# Patient Record
Sex: Male | Born: 1985 | Race: White | Hispanic: Yes | Marital: Single | Smoking: Former smoker
Health system: Southern US, Community
[De-identification: ages and names within clinical notes are randomized; demographics above are authoritative.]

---

## 2013-05-04 ENCOUNTER — Inpatient Hospital Stay (HOSPITAL_COMMUNITY)
Admission: EM | Admit: 2013-05-04 | Discharge: 2013-05-16 | DRG: 864 | Disposition: A | Discharge status: Home / Self Care | Payer: Medicaid Other | Attending: Internal Medicine | Admitting: Internal Medicine

## 2013-05-04 ENCOUNTER — Encounter (HOSPITAL_COMMUNITY): Payer: Self-pay | Admitting: *Deleted

## 2013-05-04 ENCOUNTER — Emergency Department (HOSPITAL_COMMUNITY): Payer: Medicaid Other

## 2013-05-04 DIAGNOSIS — Z87891 Personal history of nicotine dependence: Secondary | ICD-10-CM

## 2013-05-04 DIAGNOSIS — R0609 Other forms of dyspnea: Secondary | ICD-10-CM | POA: Diagnosis present

## 2013-05-04 DIAGNOSIS — R7402 Elevation of levels of lactic acid dehydrogenase (LDH): Secondary | ICD-10-CM | POA: Diagnosis present

## 2013-05-04 DIAGNOSIS — R21 Rash and other nonspecific skin eruption: Secondary | ICD-10-CM | POA: Diagnosis present

## 2013-05-04 DIAGNOSIS — E876 Hypokalemia: Secondary | ICD-10-CM | POA: Diagnosis present

## 2013-05-04 DIAGNOSIS — E861 Hypovolemia: Secondary | ICD-10-CM | POA: Diagnosis present

## 2013-05-04 DIAGNOSIS — R0989 Other specified symptoms and signs involving the circulatory and respiratory systems: Secondary | ICD-10-CM | POA: Diagnosis present

## 2013-05-04 DIAGNOSIS — J039 Acute tonsillitis, unspecified: Secondary | ICD-10-CM

## 2013-05-04 DIAGNOSIS — R7401 Elevation of levels of liver transaminase levels: Secondary | ICD-10-CM | POA: Diagnosis present

## 2013-05-04 DIAGNOSIS — R599 Enlarged lymph nodes, unspecified: Secondary | ICD-10-CM | POA: Diagnosis present

## 2013-05-04 DIAGNOSIS — J189 Pneumonia, unspecified organism: Secondary | ICD-10-CM

## 2013-05-04 DIAGNOSIS — R509 Fever, unspecified: Principal | ICD-10-CM | POA: Diagnosis present

## 2013-05-04 DIAGNOSIS — IMO0001 Reserved for inherently not codable concepts without codable children: Secondary | ICD-10-CM | POA: Diagnosis present

## 2013-05-04 DIAGNOSIS — E871 Hypo-osmolality and hyponatremia: Secondary | ICD-10-CM

## 2013-05-04 DIAGNOSIS — J029 Acute pharyngitis, unspecified: Secondary | ICD-10-CM | POA: Diagnosis present

## 2013-05-04 DIAGNOSIS — R000 Tachycardia, unspecified: Secondary | ICD-10-CM | POA: Diagnosis present

## 2013-05-04 DIAGNOSIS — R590 Localized enlarged lymph nodes: Secondary | ICD-10-CM

## 2013-05-04 DIAGNOSIS — R011 Cardiac murmur, unspecified: Secondary | ICD-10-CM | POA: Diagnosis present

## 2013-05-04 DIAGNOSIS — R072 Precordial pain: Secondary | ICD-10-CM | POA: Diagnosis present

## 2013-05-04 LAB — URINALYSIS, ROUTINE W REFLEX MICROSCOPIC
Glucose, UA: NEGATIVE mg/dL
Hgb urine dipstick: NEGATIVE
Leukocytes, UA: NEGATIVE
Protein, ur: 30 mg/dL — AB
Specific Gravity, Urine: 1.024 (ref 1.005–1.030)
pH: 6 (ref 5.0–8.0)

## 2013-05-04 LAB — CBC WITH DIFFERENTIAL/PLATELET
Basophils Absolute: 0 10*3/uL (ref 0.0–0.1)
Basophils Relative: 0 % (ref 0–1)
Eosinophils Absolute: 0 10*3/uL (ref 0.0–0.7)
Eosinophils Relative: 0 % (ref 0–5)
HCT: 42.4 % (ref 39.0–52.0)
Lymphocytes Relative: 9 % — ABNORMAL LOW (ref 12–46)
MCHC: 36.6 g/dL — ABNORMAL HIGH (ref 30.0–36.0)
MCV: 83.8 fL (ref 78.0–100.0)
Monocytes Absolute: 0.7 10*3/uL (ref 0.1–1.0)
Platelets: 134 10*3/uL — ABNORMAL LOW (ref 150–400)
RDW: 11.7 % (ref 11.5–15.5)
WBC: 6.8 10*3/uL (ref 4.0–10.5)

## 2013-05-04 LAB — URINE MICROSCOPIC-ADD ON

## 2013-05-04 LAB — COMPREHENSIVE METABOLIC PANEL
ALT: 19 U/L (ref 0–53)
AST: 56 U/L — ABNORMAL HIGH (ref 0–37)
Albumin: 3.2 g/dL — ABNORMAL LOW (ref 3.5–5.2)
CO2: 25 mEq/L (ref 19–32)
Calcium: 8.2 mg/dL — ABNORMAL LOW (ref 8.4–10.5)
Creatinine, Ser: 0.94 mg/dL (ref 0.50–1.35)
GFR calc non Af Amer: >90 mL/min (ref >90)
Sodium: 127 mEq/L — ABNORMAL LOW (ref 135–145)
Total Protein: 7.2 g/dL (ref 6.0–8.3)

## 2013-05-04 LAB — PROCALCITONIN: Procalcitonin: 0.88 ng/mL

## 2013-05-04 LAB — CG4 I-STAT (LACTIC ACID): Lactic Acid, Venous: 1.32 mmol/L (ref 0.5–2.2)

## 2013-05-04 MED ORDER — SODIUM CHLORIDE 0.9 % IV SOLN
1000.0000 mL | Freq: Once | INTRAVENOUS | Status: AC
  Administered 2013-05-05: 1000 mL via INTRAVENOUS

## 2013-05-04 MED ORDER — SODIUM CHLORIDE 0.9 % IV BOLUS (SEPSIS)
1000.0000 mL | Freq: Once | INTRAVENOUS | Status: AC
  Administered 2013-05-04: 1000 mL via INTRAVENOUS

## 2013-05-04 MED ORDER — ACETAMINOPHEN 325 MG PO TABS
650.0000 mg | ORAL_TABLET | Freq: Four times a day (QID) | ORAL | Status: DC | PRN
  Administered 2013-05-04: 650 mg via ORAL
  Filled 2013-05-04: qty 1
  Filled 2013-05-04 (×18): qty 2
  Filled 2013-05-04: qty 1
  Filled 2013-05-04 (×6): qty 2

## 2013-05-04 MED ORDER — SODIUM CHLORIDE 0.9 % IV SOLN: 1000.0000 mL | INTRAVENOUS | Status: DC

## 2013-05-04 MED ORDER — SODIUM CHLORIDE 0.9 % IV SOLN
1000.0000 mL | Freq: Once | INTRAVENOUS | Status: AC
  Administered 2013-05-04: 1000 mL via INTRAVENOUS

## 2013-05-04 NOTE — ED Provider Notes (Signed)
CSN: [id number]     Arrival date & time 05/04/13  2106 History     First MD Initiated Contact with Patient 05/04/13 2113     Chief Complaint  Patient presents with  . Fever  . Generalized Body Aches   (Consider location/radiation/quality/duration/timing/severity/associated sxs/prior Treatment) The history is provided by the patient and medical records. No language interpreter was used.    Cambell Stanek is a 27 y.o. male  with noted medical history presents to the Emergency Department complaining of gradual, persistent, progressively worsening high fevers with associated chills and right ureters, rash and left-sided chest pain beginning 8 days ago.  Patient's wife states they have tried many home remedies including Tylenol and ibuprofen without improvement in patient's fever or rash.  She states the rash was first noticed on the patient's left elbow but on further inspection realized that it was covering most of his body.  Denies sick contacts, foreign travel. Patient has associated decreased appetite, decreased oral intake and mild sore throat. Nothing seems to make the patient's symptoms better or worse.  Pt denies neck stiffness, headache, abdominal pain, nausea, vomiting, diarrhea, weakness, dizziness, syncope.     History reviewed. No pertinent past medical history. History reviewed. No pertinent past surgical history. No family history on file. History  Substance Use Topics  . Smoking status: Former Games developer  . Smokeless tobacco: Not on file  . Alcohol Use: No    Review of Systems  Constitutional: Positive for fever, appetite change and fatigue. Negative for diaphoresis and unexpected weight change.  HENT: Negative for mouth sores and neck stiffness.   Eyes: Negative for visual disturbance.  Respiratory: Positive for chest tightness. Negative for cough, shortness of breath and wheezing.   Cardiovascular: Positive for chest pain.  Gastrointestinal: Negative for nausea, vomiting,  abdominal pain, diarrhea and constipation.  Endocrine: Negative for polydipsia, polyphagia and polyuria.  Genitourinary: Negative for dysuria, urgency, frequency and hematuria.  Musculoskeletal: Positive for myalgias (generalized) and arthralgias (generalized). Negative for back pain.  Skin: Positive for rash.  Allergic/Immunologic: Negative for immunocompromised state.  Neurological: Negative for syncope, light-headedness and headaches.  Hematological: Does not bruise/bleed easily.  Psychiatric/Behavioral: Negative for sleep disturbance. The patient is not nervous/anxious.     Allergies  Review of patient's allergies indicates no known allergies.  Home Medications   Current Outpatient Rx  Name  Route  Sig  Dispense  Refill  . acetaminophen (TYLENOL) 500 MG tablet   Oral   Take 500 mg by mouth every 6 (six) hours as needed for pain.         Marland Kitchen ibuprofen (ADVIL,MOTRIN) 200 MG tablet   Oral   Take 600 mg by mouth every 6 (six) hours as needed for pain.          BP 115/65  Pulse 90  Temp(Src) 100.5 F (38.1 C) (Oral)  Resp 29  SpO2 97% Physical Exam  Nursing note and vitals reviewed. Constitutional: He is oriented to person, place, and time. He appears well-developed and well-nourished. No distress.  Awake, alert, ill appearing  HENT:  Head: Normocephalic and atraumatic.  Right Ear: Hearing, tympanic membrane, external ear and ear canal normal.  Left Ear: Hearing, tympanic membrane, external ear and ear canal normal.  Nose: Nose normal. No mucosal edema.  Mouth/Throat: Uvula is midline. Mucous membranes are dry and not cyanotic. No edematous. Posterior oropharyngeal erythema (mild) present. No oropharyngeal exudate, posterior oropharyngeal edema or tonsillar abscesses.  Eyes: Conjunctivae and EOM are normal. Pupils are  equal, round, and reactive to light. No scleral icterus.  Neck: Normal range of motion and full passive range of motion without pain. Neck supple. No  spinous process tenderness and no muscular tenderness present. No rigidity. Normal range of motion present. No Brudzinski's sign and no Kernig's sign noted.  Nuchal rigidity  Cardiovascular: Regular rhythm, normal heart sounds and intact distal pulses.   No murmur heard. Tachycardic  Pulmonary/Chest: Effort normal and breath sounds normal. No respiratory distress. He has no wheezes.  Clear and equal breath sounds bilaterally  Abdominal: Soft. Bowel sounds are normal. He exhibits no distension and no mass. There is no tenderness. There is no rebound and no guarding.  Abdomen soft and nontender  Musculoskeletal: Normal range of motion. He exhibits no edema.  Lymphadenopathy:    He has no cervical adenopathy.  Neurological: He is alert and oriented to person, place, and time. He has normal reflexes. No cranial nerve deficit. He exhibits normal muscle tone. Coordination normal.  Speech is clear and goal oriented, follows commands Major Cranial nerves without deficit, no facial droop Normal strength in upper and lower extremities bilaterally including dorsiflexion and plantar flexion, strong and equal grip strength Sensation normal to light and sharp touch Moves extremities without ataxia, coordination intact Normal finger to nose and rapid alternating movements Normal gait and balance  Skin: Skin is warm. Rash noted. He is diaphoretic.  Diffuse patches of punctate, erythematous, blanching rash, without scaling  No papules, pustules or vesicles No urticaria No petechiae or purpura   Psychiatric: He has a normal mood and affect.    ED Course   Procedures (including critical care time)  Labs Reviewed  CBC WITH DIFFERENTIAL - Abnormal; Notable for the following:    MCHC 36.6 (*)    Platelets 134 (*)    Neutrophils Relative % 80 (*)    Lymphocytes Relative 9 (*)    Lymphs Abs 0.6 (*)    All other components within normal limits  COMPREHENSIVE METABOLIC PANEL - Abnormal; Notable for  the following:    Sodium 127 (*)    Potassium 3.4 (*)    Chloride 91 (*)    Glucose, Bld 102 (*)    Calcium 8.2 (*)    Albumin 3.2 (*)    AST 56 (*)    All other components within normal limits  URINALYSIS, ROUTINE W REFLEX MICROSCOPIC - Abnormal; Notable for the following:    Protein, ur 30 (*)    Urobilinogen, UA 2.0 (*)    All other components within normal limits  CULTURE, BLOOD (ROUTINE X 2)  CULTURE, BLOOD (ROUTINE X 2)  URINE CULTURE  LIPASE, BLOOD  URINE MICROSCOPIC-ADD ON  PROCALCITONIN  CG4 I-STAT (LACTIC ACID)   Dg Chest 2 View  05/04/2013   *RADIOLOGY REPORT*  Clinical Data: Fever, body aches, former smoker  CHEST - 2 VIEW  Comparison: None.  Findings: Normal cardiac silhouette and mediastinal contours. There is minimal bilateral infrahilar opacities which are favored to represent atelectasis.  Possible developing heterogeneous air space opacity within the left lower lung.  No focal airspace opacities.  No pleural effusion or pneumothorax.  No definite evidence of edema.  Grossly unchanged bones.  IMPRESSION:  Possible developing air space opacity within the left lower lung worrisome for infection.   Original Report Authenticated By: Tacey Ruiz, MD   1. Fever   2. Hyponatremia   3. Tachycardia   4. Rash     MDM  Marty Heck presents with fever  of 105, tachycardia and nondescript rash.  Patient also complaining of myalgias, arthralgias.  Rash is diffuse and nondescript. No nuchal rigidity, no petechial rash, is significant concern for meningitis.  No neurologic deficit.  Lactic acid within normal limits, CBC without leukocytosis, CMP with hyponatremia and mild hypokalemia, lipase within normal.  UA without evidence of urinary tract infection. Chest x-ray with questionable airspace opacity in the left lower lung worrisome for infection seen in the AP view, but opacity is not seen on the lateral view.  I personally reviewed the imaging tests through PACS system.  I reviewed  available ER/hospitalization records through the EMR.     1:22 AM Patient with decrease in her rate and oral temperature of 100.5.  No source of infection found, no cause for rash. We'll proceed with admission.  Dr. Denton Lank was consulted, evaluated this patient with me and agrees with the plan.       Dahlia Client Tarissa Kerin, PA-C 05/05/13 [phone]

## 2013-05-04 NOTE — ED Notes (Signed)
Pt c/o fever and no appetite x 8 days; c/o severe muscle/bone pain; increased with ambulation; at times per family pt c/o upper left quad pain that comes and goes; denies nausea/vomiting; denies cough/congestion

## 2013-05-05 ENCOUNTER — Observation Stay (HOSPITAL_COMMUNITY): Payer: Medicaid Other

## 2013-05-05 ENCOUNTER — Encounter (HOSPITAL_COMMUNITY): Payer: Self-pay

## 2013-05-05 DIAGNOSIS — E871 Hypo-osmolality and hyponatremia: Secondary | ICD-10-CM

## 2013-05-05 DIAGNOSIS — J189 Pneumonia, unspecified organism: Secondary | ICD-10-CM

## 2013-05-05 DIAGNOSIS — R509 Fever, unspecified: Principal | ICD-10-CM

## 2013-05-05 DIAGNOSIS — R21 Rash and other nonspecific skin eruption: Secondary | ICD-10-CM

## 2013-05-05 DIAGNOSIS — R000 Tachycardia, unspecified: Secondary | ICD-10-CM

## 2013-05-05 LAB — COMPREHENSIVE METABOLIC PANEL
AST: 50 U/L — ABNORMAL HIGH (ref 0–37)
BUN: 9 mg/dL (ref 6–23)
CO2: 24 mEq/L (ref 19–32)
Calcium: 7.3 mg/dL — ABNORMAL LOW (ref 8.4–10.5)
Creatinine, Ser: 0.85 mg/dL (ref 0.50–1.35)
GFR calc non Af Amer: >90 mL/min (ref >90)

## 2013-05-05 LAB — CBC
Hemoglobin: 12.9 g/dL — ABNORMAL LOW (ref 13.0–17.0)
MCH: 30.1 pg (ref 26.0–34.0)
Platelets: 150 10*3/uL (ref 150–400)
RBC: 4.29 MIL/uL (ref 4.22–5.81)
WBC: 6.7 10*3/uL (ref 4.0–10.5)

## 2013-05-05 LAB — HIV ANTIBODY (ROUTINE TESTING W REFLEX): HIV: NONREACTIVE

## 2013-05-05 MED ORDER — SODIUM CHLORIDE 0.9 % IV SOLN
INTRAVENOUS | Status: DC
  Administered 2013-05-05: 21:00:00 via INTRAVENOUS
  Administered 2013-05-05: 125 mL/h via INTRAVENOUS
  Administered 2013-05-06: 06:00:00 via INTRAVENOUS
  Administered 2013-05-06: 100 mL/h via INTRAVENOUS
  Administered 2013-05-09 – 2013-05-16 (×16): via INTRAVENOUS

## 2013-05-05 MED ORDER — ACETAMINOPHEN 650 MG RE SUPP: 650.0000 mg | Freq: Four times a day (QID) | RECTAL | Status: DC | PRN

## 2013-05-05 MED ORDER — PROMETHAZINE HCL 25 MG PO TABS: 12.5000 mg | ORAL_TABLET | Freq: Four times a day (QID) | ORAL | Status: DC | PRN

## 2013-05-05 MED ORDER — IBUPROFEN 800 MG PO TABS
800.0000 mg | ORAL_TABLET | Freq: Once | ORAL | Status: AC
  Administered 2013-05-05: 800 mg via ORAL
  Filled 2013-05-05: qty 1

## 2013-05-05 MED ORDER — ACETAMINOPHEN 325 MG PO TABS
650.0000 mg | ORAL_TABLET | Freq: Four times a day (QID) | ORAL | Status: DC | PRN
  Administered 2013-05-05 – 2013-05-16 (×27): 650 mg via ORAL
  Filled 2013-05-05 (×3): qty 2

## 2013-05-05 MED ORDER — DOXYCYCLINE HYCLATE 100 MG IV SOLR
100.0000 mg | Freq: Two times a day (BID) | INTRAVENOUS | Status: DC
  Administered 2013-05-05 – 2013-05-07 (×6): 100 mg via INTRAVENOUS
  Filled 2013-05-05 (×8): qty 100

## 2013-05-05 MED ORDER — SODIUM CHLORIDE 0.9 % IV SOLN
1000.0000 mL | INTRAVENOUS | Status: DC
  Administered 2013-05-05: 1000 mL via INTRAVENOUS

## 2013-05-05 NOTE — ED Notes (Signed)
Admitting physician at bedside

## 2013-05-05 NOTE — Progress Notes (Signed)
[facility] PROGRESS NOTE  Benjamin Fitzpatrick AVW:[id number] DOB: [birth year] DOA: 05/04/2013 PCP: No primary provider on file.  Assessment/Plan: 1. Fever, Myalgia: viral vs secondary to PNA. Continue with support care. RMSF test pending.  2. PNA: Continue with doxy to cover for PNA and RMSF. Check HIV.   Code Status: full code.  Family Communication:care discussed with patient.  Disposition Plan: remain inpatient.    Consultants:  none  Procedures:  none  Antibiotics:  Doxy 7-28  HPI/Subjective: Feeling better. He was having generalized pain, chest pain.   Objective: Filed Vitals:   05/05/13 0130 05/05/13 0210 05/05/13 0600 05/05/13 0649  BP: 128/70 119/76 129/76   Pulse: 92 94 106   Temp:  100.6 F (38.1 C) 102 F (38.9 C) 100.4 F (38 C)  TempSrc:  Oral Oral Oral  Resp: 22 20 20    Height:  5\' 6"  (1.676 m)    Weight:  63.8 kg (140 lb 10.5 oz)    SpO2: 98% 99% 95%     Intake/Output Summary (Last 24 hours) at 05/05/13 1124 Last data filed at 05/05/13 0900  Gross per 24 hour  Intake    300 ml  Output    950 ml  Net   -650 ml   Filed Weights   05/05/13 0210  Weight: 63.8 kg (140 lb 10.5 oz)    Exam:   General:  No distress.   Cardiovascular: S 1, S 2 RRR  Respiratory: CTA  Abdomen: BS present, soft, nt  Musculoskeletal: no edema.   Data Reviewed: Basic Metabolic Panel:  Recent Labs Lab 05/04/13 2230 05/05/13 0430  NA 127* 131*  K 3.4* 3.5  CL 91* 98  CO2 25 24  GLUCOSE 102* 127*  BUN 11 9  CREATININE 0.94 0.85  CALCIUM 8.2* 7.3*   Liver Function Tests:  Recent Labs Lab 05/04/13 2230 05/05/13 0430  AST 56* 50*  ALT 19 14  ALKPHOS 62 51  BILITOT 0.3 0.2*  PROT 7.2 6.0  ALBUMIN 3.2* 2.6*    Recent Labs Lab 05/04/13 2230  LIPASE 29   No results found for this basename: AMMONIA,  in the last 168 hours CBC:  Recent Labs Lab 05/04/13 2230 05/05/13 0430  WBC 6.8 6.7  NEUTROABS 5.5  --   HGB 15.5 12.9*  HCT 42.4  36.2*  MCV 83.8 84.4  PLT 134* 150   Cardiac Enzymes: No results found for this basename: CKTOTAL, CKMB, CKMBINDEX, TROPONINI,  in the last 168 hours BNP (last 3 results) No results found for this basename: PROBNP,  in the last 8760 hours CBG: No results found for this basename: GLUCAP,  in the last 168 hours  No results found for this or any previous visit (from the past 240 hour(s)).   Studies: Dg Chest 2 View  05/05/2013   *RADIOLOGY REPORT*  Clinical Data: Chest pain  CHEST - 2 VIEW  Comparison: May 04, 2013  Findings: There is mild patchy opacity in the medial left lung base.  There is no pleural effusion bilaterally.  There is no pulmonary edema.  The mediastinal contour and cardiac silhouette are normal.  The soft tissues and osseous structures are stable.  IMPRESSION: Probable early pneumonia of left lung base.   Original Report Authenticated By: Sherian Rein, M.D.   Dg Chest 2 View  05/04/2013   *RADIOLOGY REPORT*  Clinical Data: Fever, body aches, former smoker  CHEST - 2 VIEW  Comparison: None.  Findings: Normal cardiac silhouette and mediastinal contours. There  is minimal bilateral infrahilar opacities which are favored to represent atelectasis.  Possible developing heterogeneous air space opacity within the left lower lung.  No focal airspace opacities.  No pleural effusion or pneumothorax.  No definite evidence of edema.  Grossly unchanged bones.  IMPRESSION:  Possible developing air space opacity within the left lower lung worrisome for infection.   Original Report Authenticated By: Tacey Ruiz, MD    Scheduled Meds: . doxycycline (VIBRAMYCIN) IV  100 mg Intravenous BID   Continuous Infusions: . sodium chloride      Active Problems:   Fever   Hyponatremia   Tachycardia   Rash    Time spent: 25 minutes.     Benjamin Fitzpatrick  [facility] Pager [phone]. If 7PM-7AM, please contact night-coverage at [email or web address], password [facility] 05/05/2013, 11:24 AM  LOS: 1  day

## 2013-05-05 NOTE — Progress Notes (Signed)
Patient appears hot to touch, temperature = 100.4 , given po tylenol, offered po liquids but declined

## 2013-05-05 NOTE — Progress Notes (Deleted)
[facility] History and Physical  Izaih Kataoka ZOX:[id number] DOB: May 22, 1986 DOA: 05/04/2013  Referring physician: Dahlia Client ED PA PCP: No primary provider on file.  Specialists: none  Chief Complaint: fever   HPI: Blaize Epple is a 27 y.o. male  Hispanic male from [place] presented to the emergency room 22,040 with a day. History of chills, regular. Mild left-sided chest pain. They've tried ibuprofen, Tylenol, and noticed a rash on patient's elbow which was is very nonpathognomic-he has not taken Any antibiotics for this, but has felt week. She's not been able to eat or drink much more so he can has difficulty swallowing. He has no cough, no diarrhea. No shortness breath no blurred vision, no double vision. No dysuria, no hematuria, no diarrhea. No ill contacts, no blurred vision, no double vision. No weakness in any one side of body   Review of Systems: The patient denies contact with anyone who has been sick, outside food, dysentery, hematemesis, melena. He does have a very nonspecific rash  History reviewed. No pertinent past medical history. History reviewed. No pertinent past surgical history. Social History:  reports that he has quit smoking. He does not have any smokeless tobacco history on file. He reports that he does not drink alcohol. His drug history is not on file. Lives at home with his wife  No Known Allergies  No family history on file. patient and sure  Prior to Admission medications   Medication Sig Start Date End Date Taking? Authorizing Provider  acetaminophen (TYLENOL) 500 MG tablet Take 500 mg by mouth every 6 (six) hours as needed for pain.   Yes Historical Provider, MD  ibuprofen (ADVIL,MOTRIN) 200 MG tablet Take 600 mg by mouth every 6 (six) hours as needed for pain.   Yes Historical Provider, MD   Physical Exam: Filed Vitals:   05/04/13 2130 05/04/13 2201 05/05/13 0030 05/05/13 0036  BP: 128/73 122/73 115/65   Pulse: 116 101 90   Temp: 100.3 F (37.9 C)  104.7 F (40.4 C)  100.5 F (38.1 C)  TempSrc:  Rectal  Oral  Resp: 20 20 29    SpO2: 96% 97% 97%      General:  Alert wasn't oriented   Eyes: Left tonsil seems enlarged. There is some submandibular lymphadenopathy which is somewhat painful. No thyromegaly   ENT: Moderate dentition   Neck: Soft supple, no bruit in ski sent   Cardiovascular: S1, S2 no murmur, rub or gallop. Tachycardic   Respiratory: Clinically clear   Abdomen: Soft, nontender, no rebound no guarding   Skin: Rash on left elbow. Small punctate lesions over the rest body, which are very nonspecific and scattered   Musculoskeletal: Range of motion   Psychiatric: Euthymic   Neurologic: Grossly intact  Labs on Admission:  Basic Metabolic Panel:  Recent Labs Lab 05/04/13 2230  NA 127*  K 3.4*  CL 91*  CO2 25  GLUCOSE 102*  BUN 11  CREATININE 0.94  CALCIUM 8.2*   Liver Function Tests:  Recent Labs Lab 05/04/13 2230  AST 56*  ALT 19  ALKPHOS 62  BILITOT 0.3  PROT 7.2  ALBUMIN 3.2*    Recent Labs Lab 05/04/13 2230  LIPASE 29   No results found for this basename: AMMONIA,  in the last 168 hours CBC:  Recent Labs Lab 05/04/13 2230  WBC 6.8  NEUTROABS 5.5  HGB 15.5  HCT 42.4  MCV 83.8  PLT 134*   Cardiac Enzymes: No results found for this basename: CKTOTAL, CKMB,  CKMBINDEX, TROPONINI,  in the last 168 hours  BNP (last 3 results) No results found for this basename: PROBNP,  in the last 8760 hours CBG: No results found for this basename: GLUCAP,  in the last 168 hours  Radiological Exams on Admission: Dg Chest 2 View  05/04/2013   *RADIOLOGY REPORT*  Clinical Data: Fever, body aches, former smoker  CHEST - 2 VIEW  Comparison: None.  Findings: Normal cardiac silhouette and mediastinal contours. There is minimal bilateral infrahilar opacities which are favored to represent atelectasis.  Possible developing heterogeneous air space opacity within the left lower lung.  No focal  airspace opacities.  No pleural effusion or pneumothorax.  No definite evidence of edema.  Grossly unchanged bones.  IMPRESSION:  Possible developing air space opacity within the left lower lung worrisome for infection.   Original Report Authenticated By: Tacey Ruiz, MD    EKG: Independently reviewed. None performed  Assessment/Plan Active Problems:   Fever   Hyponatremia   Tachycardia   1. Fever-source, most likely a tonsillitis. Less likely, is potentially [facility] spotted fever-given his history of rash, however, I cannot completely rule this out. Chest x-ray was equivocal and my reading for pneumonia, and I would not treat this as community-acquired pneumonia. start him on doxycycline 100 twice a day, by mouth, which can be completed in about 10 days and will hopefully treat both sources. He initially wanted to go home, but thinks it is better, if he stays in the hospital, which is very reasonable as he is somewhat tachycardic  2. Hyponatremia probably from T. postpartum 80 and not eating much. IV fluids 100 cc hour overnight. Expect this will resolve. Repeat labs in a.m. 3. Tachycardia, probably secondary #1. Will need IV fluids overnight as this is likely responsible same. Low threshold think there is any other source   4. Rash-see above      Code Status: Full  Family Communication: Discussed with wife at bedside   Disposition Plan: Obs, tele inpatient  Time spent: 50  Rhetta Mura [facility] Pager [phone]  If 7PM-7AM, please contact night-coverage [email or web address] Password [email or web address] 05/05/2013, 1:03 AM

## 2013-05-06 LAB — URINE CULTURE

## 2013-05-06 LAB — ROCKY MTN SPOTTED FVR AB, IGG-BLOOD: RMSF IgG: 0.06 IV

## 2013-05-06 LAB — BASIC METABOLIC PANEL
CO2: 26 mEq/L (ref 19–32)
Chloride: 104 mEq/L (ref 96–112)
Creatinine, Ser: 0.82 mg/dL (ref 0.50–1.35)
GFR calc Af Amer: >90 mL/min (ref >90)
Potassium: 3.7 mEq/L (ref 3.5–5.1)
Sodium: 139 mEq/L (ref 135–145)

## 2013-05-06 LAB — ROCKY MTN SPOTTED FVR AB, IGM-BLOOD: RMSF IgM: 0.83 IV (ref 0.00–0.89)

## 2013-05-06 MED ORDER — ENSURE COMPLETE PO LIQD
237.0000 mL | Freq: Two times a day (BID) | ORAL | Status: DC
  Administered 2013-05-09 – 2013-05-16 (×9): 237 mL via ORAL

## 2013-05-06 MED ORDER — DEXTROSE 5 % IV SOLN
1.0000 g | INTRAVENOUS | Status: DC
  Administered 2013-05-06 – 2013-05-09 (×4): 1 g via INTRAVENOUS
  Filled 2013-05-06 (×5): qty 10

## 2013-05-06 MED ORDER — DEXTROSE 5 % IV SOLN
500.0000 mg | INTRAVENOUS | Status: DC
  Administered 2013-05-06 – 2013-05-09 (×4): 500 mg via INTRAVENOUS
  Filled 2013-05-06 (×5): qty 500

## 2013-05-06 MED ORDER — IBUPROFEN 800 MG PO TABS
800.0000 mg | ORAL_TABLET | Freq: Once | ORAL | Status: AC
  Administered 2013-05-06: 800 mg via ORAL
  Filled 2013-05-06: qty 1

## 2013-05-06 NOTE — Progress Notes (Addendum)
[facility] PROGRESS NOTE  Calin Fantroy AOZ:[id number] DOB: [birth year] DOA: 05/04/2013 PCP: No primary provider on file.  Assessment/Plan: 1. Fever, Myalgia/ Rash: viral vs secondary to PNA. Patient spike fever at 103. I will broad antibiotics coverage. Continue with support care. RMSF test less than 0.06. Ehrlichia pending. If ehrlichia antibodies negative would DC doxycycline.  2. PNA: I will start Ceftriaxone and Azithromycin because patient spike fever on doxy. Marland Kitchen HIV negative. Blood culture no growth to date.   Addendum:   3.   Hyponatremia: Improved with IV fluids.   Code Status: full code.  Family Communication:care discussed with patient.  Disposition Plan: remain inpatient.    Consultants:  none  Procedures:  none  Antibiotics:  Doxy 7-28  Ceftriaxone and Azithromycin 7-29  HPI/Subjective: Feeling better.  No that much pain. Had fever earlier this morning.   Objective: Filed Vitals:   05/06/13 0036 05/06/13 0547 05/06/13 1108 05/06/13 1459  BP:  115/69  118/74  Pulse:  112  102  Temp: 98 F (36.7 C) 103.1 F (39.5 C) 99.2 F (37.3 C) 100.6 F (38.1 C)  TempSrc:  Oral Oral Oral  Resp:  18  20  Height:      Weight:      SpO2:  97%  97%    Intake/Output Summary (Last 24 hours) at 05/06/13 1659 Last data filed at 05/06/13 1500  Gross per 24 hour  Intake 4530.41 ml  Output   3100 ml  Net 1430.41 ml   Filed Weights   05/05/13 0210  Weight: 63.8 kg (140 lb 10.5 oz)    Exam:   General:  No distress.   Cardiovascular: S 1, S 2 RRR  Respiratory: CTA  Abdomen: BS present, soft, nt  Musculoskeletal: no edema.   Data Reviewed: Basic Metabolic Panel:  Recent Labs Lab 05/04/13 2230 05/05/13 0430 05/06/13 0445  NA 127* 131* 139  K 3.4* 3.5 3.7  CL 91* 98 104  CO2 25 24 26   GLUCOSE 102* 127* 111*  BUN 11 9 6   CREATININE 0.94 0.85 0.82  CALCIUM 8.2* 7.3* 8.1*   Liver Function Tests:  Recent Labs Lab 05/04/13 2230  05/05/13 0430  AST 56* 50*  ALT 19 14  ALKPHOS 62 51  BILITOT 0.3 0.2*  PROT 7.2 6.0  ALBUMIN 3.2* 2.6*    Recent Labs Lab 05/04/13 2230  LIPASE 29   No results found for this basename: AMMONIA,  in the last 168 hours CBC:  Recent Labs Lab 05/04/13 2230 05/05/13 0430  WBC 6.8 6.7  NEUTROABS 5.5  --   HGB 15.5 12.9*  HCT 42.4 36.2*  MCV 83.8 84.4  PLT 134* 150   Cardiac Enzymes: No results found for this basename: CKTOTAL, CKMB, CKMBINDEX, TROPONINI,  in the last 168 hours BNP (last 3 results) No results found for this basename: PROBNP,  in the last 8760 hours CBG: No results found for this basename: GLUCAP,  in the last 168 hours  Recent Results (from the past 240 hour(s))  URINE CULTURE     Status: None   Collection Time    05/04/13  9:40 PM      Result Value Range Status   Specimen Description URINE, RANDOM   Final   Special Requests NONE   Final   Culture  Setup Time 05/05/2013 11:37   Final   Colony Count 2,000 COLONIES/ML   Final   Culture INSIGNIFICANT GROWTH   Final   Report Status 05/06/2013 FINAL  Final  CULTURE, BLOOD (ROUTINE X 2)     Status: None   Collection Time    05/04/13 10:30 PM      Result Value Range Status   Specimen Description BLOOD LEFT ANTECUBITAL   Final   Special Requests BOTTLES DRAWN AEROBIC AND ANAEROBIC 5CC   Final   Culture  Setup Time 05/05/2013 10:42   Final   Culture     Final   Value:        BLOOD CULTURE RECEIVED NO GROWTH TO DATE CULTURE WILL BE HELD FOR 5 DAYS BEFORE ISSUING A FINAL NEGATIVE REPORT   Report Status PENDING   Incomplete  CULTURE, BLOOD (ROUTINE X 2)     Status: None   Collection Time    05/04/13 11:00 PM      Result Value Range Status   Specimen Description BLOOD RIGHT ANTECUBITAL   Final   Special Requests BOTTLES DRAWN AEROBIC AND ANAEROBIC 5CC   Final   Culture  Setup Time 05/05/2013 10:42   Final   Culture     Final   Value:        BLOOD CULTURE RECEIVED NO GROWTH TO DATE CULTURE WILL BE HELD  FOR 5 DAYS BEFORE ISSUING A FINAL NEGATIVE REPORT   Report Status PENDING   Incomplete     Studies: Dg Chest 2 View  05/05/2013   *RADIOLOGY REPORT*  Clinical Data: Chest pain  CHEST - 2 VIEW  Comparison: May 04, 2013  Findings: There is mild patchy opacity in the medial left lung base.  There is no pleural effusion bilaterally.  There is no pulmonary edema.  The mediastinal contour and cardiac silhouette are normal.  The soft tissues and osseous structures are stable.  IMPRESSION: Probable early pneumonia of left lung base.   Original Report Authenticated By: Sherian Rein, M.D.   Dg Chest 2 View  05/04/2013   *RADIOLOGY REPORT*  Clinical Data: Fever, body aches, former smoker  CHEST - 2 VIEW  Comparison: None.  Findings: Normal cardiac silhouette and mediastinal contours. There is minimal bilateral infrahilar opacities which are favored to represent atelectasis.  Possible developing heterogeneous air space opacity within the left lower lung.  No focal airspace opacities.  No pleural effusion or pneumothorax.  No definite evidence of edema.  Grossly unchanged bones.  IMPRESSION:  Possible developing air space opacity within the left lower lung worrisome for infection.   Original Report Authenticated By: Tacey Ruiz, MD    Scheduled Meds: . azithromycin  500 mg Intravenous Q24H  . cefTRIAXone (ROCEPHIN)  IV  1 g Intravenous Q24H  . doxycycline (VIBRAMYCIN) IV  100 mg Intravenous BID  . feeding supplement  237 mL Oral BID BM   Continuous Infusions: . sodium chloride 100 mL/hr at 05/06/13 1218    Active Problems:   Fever   Hyponatremia   Tachycardia   Rash    Time spent: 25 minutes.     Amorita Vanrossum  [facility] Pager [phone]. If 7PM-7AM, please contact night-coverage at [email or web address], password [facility] 05/06/2013, 4:59 PM  LOS: 2 days

## 2013-05-06 NOTE — ED Provider Notes (Signed)
Medical screening examination/treatment/procedure(s) were conducted as a shared visit with non-physician practitioner(s) and myself.  I personally evaluated the patient during the encounter Pt with fevers x 8-9 days. 104 in ed. No headache. No neck pain or stiffness. occ non prod cough. Sparse blotchy erythematous rash on trunk and extremities. Pt/fam cant say where rash started or whether its moved/progresses. No palms or soles. No mm lesions. Conjunctiva normal. Denies tick bite. No known ill contacts. Labs. Iv fluids. As prolonged fever, unclear source, possible pna on cxr, will admit to med service.   Suzi Roots, MD 05/06/13 [phone]

## 2013-05-07 ENCOUNTER — Inpatient Hospital Stay (HOSPITAL_COMMUNITY): Payer: Medicaid Other

## 2013-05-07 NOTE — Progress Notes (Signed)
[facility] PROGRESS NOTE  Benjamin Fitzpatrick JYN:[id number] DOB: November 09, 1985 DOA: 05/04/2013 PCP: No primary provider on file.  Assessment/Plan: Fever/PNA -Agree with rocephin/azithro. -Repeat CXR confirms infiltrate. -Upon history, he states for 10 days at home he had fever, chest pain with inspiration and difficulty breathing. -Will treat as CAP for now. -See no reason to investigate PE (low probability by Wells Criteria). -Will DC doxy as I do not have suspicion for RMSF.  Hyponatremia -Hypovolemic. -Resolved with IVF.  Code Status: Full code Family Communication: Patient inly  Disposition Plan: Home when medically stable.   Consultants:  None   Antibiotics:  Rocephin  Azithromycin   Subjective: Still with CP with inspiration. Has been febrile with a Tmax of 101.  Objective: Filed Vitals:   05/06/13 1742 05/06/13 2125 05/07/13 0624 05/07/13 0832  BP:  112/62 109/68   Pulse:  106 97   Temp: 99.5 F (37.5 C) 100.2 F (37.9 C) 101.2 F (38.4 C) 100.1 F (37.8 C)  TempSrc: Oral Oral Oral Oral  Resp:  20 20   Height:      Weight:      SpO2:  95% 96%     Intake/Output Summary (Last 24 hours) at 05/07/13 1413 Last data filed at 05/07/13 1253  Gross per 24 hour  Intake 1008.33 ml  Output   1500 ml  Net -491.67 ml   Filed Weights   05/05/13 0210  Weight: 63.8 kg (140 lb 10.5 oz)    Exam:   General:  AA Ox3  Cardiovascular: RRR, no M/R/G  Respiratory: CTA B  Abdomen: S/NT/ND/+BS/no masses or organomegaly noted.  Extremities: no C/C/E/+pedal pulses.   Neurologic:  Grossly intact and non-focal.  Data Reviewed: Basic Metabolic Panel:  Recent Labs Lab 05/04/13 2230 05/05/13 0430 05/06/13 0445  NA 127* 131* 139  K 3.4* 3.5 3.7  CL 91* 98 104  CO2 25 24 26   GLUCOSE 102* 127* 111*  BUN 11 9 6   CREATININE 0.94 0.85 0.82  CALCIUM 8.2* 7.3* 8.1*   Liver Function Tests:  Recent Labs Lab 05/04/13 2230 05/05/13 0430  AST 56* 50*  ALT  19 14  ALKPHOS 62 51  BILITOT 0.3 0.2*  PROT 7.2 6.0  ALBUMIN 3.2* 2.6*    Recent Labs Lab 05/04/13 2230  LIPASE 29   No results found for this basename: AMMONIA,  in the last 168 hours CBC:  Recent Labs Lab 05/04/13 2230 05/05/13 0430  WBC 6.8 6.7  NEUTROABS 5.5  --   HGB 15.5 12.9*  HCT 42.4 36.2*  MCV 83.8 84.4  PLT 134* 150   Cardiac Enzymes: No results found for this basename: CKTOTAL, CKMB, CKMBINDEX, TROPONINI,  in the last 168 hours BNP (last 3 results) No results found for this basename: PROBNP,  in the last 8760 hours CBG: No results found for this basename: GLUCAP,  in the last 168 hours  Recent Results (from the past 240 hour(s))  URINE CULTURE     Status: None   Collection Time    05/04/13  9:40 PM      Result Value Range Status   Specimen Description URINE, RANDOM   Final   Special Requests NONE   Final   Culture  Setup Time 05/05/2013 11:37   Final   Colony Count 2,000 COLONIES/ML   Final   Culture INSIGNIFICANT GROWTH   Final   Report Status 05/06/2013 FINAL   Final  CULTURE, BLOOD (ROUTINE X 2)     Status: None  Collection Time    05/04/13 10:30 PM      Result Value Range Status   Specimen Description BLOOD LEFT ANTECUBITAL   Final   Special Requests BOTTLES DRAWN AEROBIC AND ANAEROBIC 5CC   Final   Culture  Setup Time 05/05/2013 10:42   Final   Culture     Final   Value:        BLOOD CULTURE RECEIVED NO GROWTH TO DATE CULTURE WILL BE HELD FOR 5 DAYS BEFORE ISSUING A FINAL NEGATIVE REPORT   Report Status PENDING   Incomplete  CULTURE, BLOOD (ROUTINE X 2)     Status: None   Collection Time    05/04/13 11:00 PM      Result Value Range Status   Specimen Description BLOOD RIGHT ANTECUBITAL   Final   Special Requests BOTTLES DRAWN AEROBIC AND ANAEROBIC 5CC   Final   Culture  Setup Time 05/05/2013 10:42   Final   Culture     Final   Value:        BLOOD CULTURE RECEIVED NO GROWTH TO DATE CULTURE WILL BE HELD FOR 5 DAYS BEFORE ISSUING A FINAL  NEGATIVE REPORT   Report Status PENDING   Incomplete     Studies: Dg Chest Port 1 View  05/07/2013   *RADIOLOGY REPORT*  Clinical Data: Possible pneumo  PORTABLE CHEST - 1 VIEW  Comparison: 05/05/2013  Findings: Heart, mediastinal, and hilar contours are normal.  The pulmonary vascularity is normal.  There is a small focal opacity at the left lung base.  This appears new compared to recent chest radiograph.  The right lung screw clear.  The trachea is midline. The bones are unremarkable.  Visualized bowel gas pattern is normal.  IMPRESSION: Focal opacity at the medial left lung base.  This could reflect airspace disease or atelectasis.  Suggest follow-up to clearing.   Original Report Authenticated By: Britta Mccreedy, M.D.    Scheduled Meds: . azithromycin  500 mg Intravenous Q24H  . cefTRIAXone (ROCEPHIN)  IV  1 g Intravenous Q24H  . doxycycline (VIBRAMYCIN) IV  100 mg Intravenous BID  . feeding supplement  237 mL Oral BID BM   Continuous Infusions: . sodium chloride 100 mL/hr (05/06/13 1739)    Active Problems:   PNA (pneumonia)   Fever   Hyponatremia   Tachycardia   Rash    Time spent: 40 minutes.    Chaya Jan  [facility] Pager [phone]  If 7PM-7AM, please contact night-coverage at [email or web address], password [facility] 05/07/2013, 2:13 PM  LOS: 3 days

## 2013-05-08 LAB — CBC
Hemoglobin: 13.6 g/dL (ref 13.0–17.0)
RBC: 4.53 MIL/uL (ref 4.22–5.81)

## 2013-05-08 LAB — BASIC METABOLIC PANEL
GFR calc Af Amer: >90 mL/min (ref >90)
GFR calc non Af Amer: >90 mL/min (ref >90)
Potassium: 3.2 mEq/L — ABNORMAL LOW (ref 3.5–5.1)
Sodium: 136 mEq/L (ref 135–145)

## 2013-05-08 MED ORDER — MENTHOL 3 MG MT LOZG
1.0000 | LOZENGE | OROMUCOSAL | Status: DC | PRN
  Administered 2013-05-09: 3 mg via ORAL
  Filled 2013-05-08: qty 9

## 2013-05-08 NOTE — Progress Notes (Signed)
[facility] PROGRESS NOTE  Benjamin Fitzpatrick [id number]:[id number] DOB: Oct 22, 1985 DOA: 05/04/2013 PCP: No primary provider on file.  Assessment/Plan: Fever/PNA -Agree with rocephin/azithro. -Repeat CXR confirms infiltrate. -Upon history, he states for 10 days at home he had fever, chest pain with inspiration and difficulty breathing. -Will treat as CAP for now. -See no reason to investigate PE (low probability by Wells Criteria). -Seems to be defervescing.  Hyponatremia -Hypovolemic. -Resolved with IVF.  Code Status: Full code Family Communication: Patient inly  Disposition Plan: Home when medically stable, anticipate 24-48 hours.   Consultants:  None   Antibiotics:  Rocephin  Azithromycin   Subjective: Still with CP with inspiration. Has been febrile with a Tmax of 101. Complains of throat pain.  Objective: Filed Vitals:   05/08/13 0558 05/08/13 1004 05/08/13 1246 05/08/13 1409  BP: 131/75   124/76  Pulse: 83   70  Temp: 99.9 F (37.7 C) 100.2 F (37.9 C) 99.9 F (37.7 C) 99.1 F (37.3 C)  TempSrc: Oral Oral Oral Oral  Resp: 16   18  Height:      Weight:      SpO2: 97%   97%    Intake/Output Summary (Last 24 hours) at 05/08/13 1459 Last data filed at 05/08/13 1300  Gross per 24 hour  Intake   3240 ml  Output      3 ml  Net   3237 ml   Filed Weights   05/05/13 0210  Weight: 63.8 kg (140 lb 10.5 oz)    Exam:   General:  AA Ox3  Cardiovascular: RRR, no M/R/G  Respiratory: CTA B  Abdomen: S/NT/ND/+BS/no masses or organomegaly noted.  Extremities: no C/C/E/+pedal pulses.   Neurologic:  Grossly intact and non-focal.  Data Reviewed: Basic Metabolic Panel:  Recent Labs Lab 05/04/13 2230 05/05/13 0430 05/06/13 0445 05/08/13 0503  NA 127* 131* 139 136  K 3.4* 3.5 3.7 3.2*  CL 91* 98 104 100  CO2 25 24 26 25   GLUCOSE 102* 127* 111* 127*  BUN 11 9 6  5*  CREATININE 0.94 0.85 0.82 0.74  CALCIUM 8.2* 7.3* 8.1* 8.1*   Liver Function  Tests:  Recent Labs Lab 05/04/13 2230 05/05/13 0430  AST 56* 50*  ALT 19 14  ALKPHOS 62 51  BILITOT 0.3 0.2*  PROT 7.2 6.0  ALBUMIN 3.2* 2.6*    Recent Labs Lab 05/04/13 2230  LIPASE 29   No results found for this basename: AMMONIA,  in the last 168 hours CBC:  Recent Labs Lab 05/04/13 2230 05/05/13 0430 05/08/13 0503  WBC 6.8 6.7 11.4*  NEUTROABS 5.5  --   --   HGB 15.5 12.9* 13.6  HCT 42.4 36.2* 37.9*  MCV 83.8 84.4 83.7  PLT 134* 150 184   Cardiac Enzymes: No results found for this basename: CKTOTAL, CKMB, CKMBINDEX, TROPONINI,  in the last 168 hours BNP (last 3 results) No results found for this basename: PROBNP,  in the last 8760 hours CBG: No results found for this basename: GLUCAP,  in the last 168 hours  Recent Results (from the past 240 hour(s))  URINE CULTURE     Status: None   Collection Time    05/04/13  9:40 PM      Result Value Range Status   Specimen Description URINE, RANDOM   Final   Special Requests NONE   Final   Culture  Setup Time 05/05/2013 11:37   Final   Colony Count 2,000 COLONIES/ML   Final   Culture  INSIGNIFICANT GROWTH   Final   Report Status 05/06/2013 FINAL   Final  CULTURE, BLOOD (ROUTINE X 2)     Status: None   Collection Time    05/04/13 10:30 PM      Result Value Range Status   Specimen Description BLOOD LEFT ANTECUBITAL   Final   Special Requests BOTTLES DRAWN AEROBIC AND ANAEROBIC 5CC   Final   Culture  Setup Time 05/05/2013 10:42   Final   Culture     Final   Value:        BLOOD CULTURE RECEIVED NO GROWTH TO DATE CULTURE WILL BE HELD FOR 5 DAYS BEFORE ISSUING A FINAL NEGATIVE REPORT   Report Status PENDING   Incomplete  CULTURE, BLOOD (ROUTINE X 2)     Status: None   Collection Time    05/04/13 11:00 PM      Result Value Range Status   Specimen Description BLOOD RIGHT ANTECUBITAL   Final   Special Requests BOTTLES DRAWN AEROBIC AND ANAEROBIC 5CC   Final   Culture  Setup Time 05/05/2013 10:42   Final   Culture      Final   Value:        BLOOD CULTURE RECEIVED NO GROWTH TO DATE CULTURE WILL BE HELD FOR 5 DAYS BEFORE ISSUING A FINAL NEGATIVE REPORT   Report Status PENDING   Incomplete     Studies: Dg Chest Port 1 View  05/07/2013   *RADIOLOGY REPORT*  Clinical Data: Possible pneumo  PORTABLE CHEST - 1 VIEW  Comparison: 05/05/2013  Findings: Heart, mediastinal, and hilar contours are normal.  The pulmonary vascularity is normal.  There is a small focal opacity at the left lung base.  This appears new compared to recent chest radiograph.  The right lung screw clear.  The trachea is midline. The bones are unremarkable.  Visualized bowel gas pattern is normal.  IMPRESSION: Focal opacity at the medial left lung base.  This could reflect airspace disease or atelectasis.  Suggest follow-up to clearing.   Original Report Authenticated By: Britta Mccreedy, M.D.    Scheduled Meds: . azithromycin  500 mg Intravenous Q24H  . cefTRIAXone (ROCEPHIN)  IV  1 g Intravenous Q24H  . feeding supplement  237 mL Oral BID BM   Continuous Infusions: . sodium chloride 100 mL/hr (05/06/13 1739)    Active Problems:   PNA (pneumonia)   Fever   Hyponatremia   Tachycardia   Rash    Time spent: 40 minutes.    Chaya Jan  [facility] Pager [phone]  If 7PM-7AM, please contact night-coverage at [email or web address], password [facility] 05/08/2013, 2:59 PM  LOS: 4 days

## 2013-05-09 ENCOUNTER — Inpatient Hospital Stay (HOSPITAL_COMMUNITY): Payer: Medicaid Other

## 2013-05-09 ENCOUNTER — Encounter (HOSPITAL_COMMUNITY): Payer: Self-pay | Admitting: Radiology

## 2013-05-09 DIAGNOSIS — E876 Hypokalemia: Secondary | ICD-10-CM

## 2013-05-09 LAB — BASIC METABOLIC PANEL
CO2: 27 mEq/L (ref 19–32)
GFR calc non Af Amer: >90 mL/min (ref >90)
Glucose, Bld: 104 mg/dL — ABNORMAL HIGH (ref 70–99)
Potassium: 3.3 mEq/L — ABNORMAL LOW (ref 3.5–5.1)
Sodium: 136 mEq/L (ref 135–145)

## 2013-05-09 LAB — LEGIONELLA ANTIGEN, URINE: Legionella Antigen, Urine: NEGATIVE

## 2013-05-09 LAB — STREP PNEUMONIAE URINARY ANTIGEN: Strep Pneumo Urinary Antigen: NEGATIVE

## 2013-05-09 LAB — CBC
Hemoglobin: 13.9 g/dL (ref 13.0–17.0)
MCH: 29.4 pg (ref 26.0–34.0)
MCV: 83.3 fL (ref 78.0–100.0)
RBC: 4.72 MIL/uL (ref 4.22–5.81)

## 2013-05-09 MED ORDER — IOHEXOL 300 MG/ML  SOLN
80.0000 mL | Freq: Once | INTRAMUSCULAR | Status: AC | PRN
  Administered 2013-05-09: 80 mL via INTRAVENOUS

## 2013-05-09 MED ORDER — POTASSIUM CHLORIDE CRYS ER 20 MEQ PO TBCR
40.0000 meq | EXTENDED_RELEASE_TABLET | Freq: Once | ORAL | Status: AC
  Administered 2013-05-09: 40 meq via ORAL
  Filled 2013-05-09 (×2): qty 2

## 2013-05-09 NOTE — H&P (Signed)
[facility] History and Physical  Benjamin Fitzpatrick ZHY:[id number] DOB: Mar 01, 1986 DOA: 05/04/2013  Referring physician: Dahlia Client ED PA PCP: No primary provider on file.  Specialists: none  Chief Complaint: fever   HPI: Benjamin Fitzpatrick is a 27 y.o. male  Hispanic male from [place] presented to the emergency room with a day. History of chills, regular. Mild left-sided chest pain. They've tried ibuprofen, Tylenol, and noticed a rash on patient's elbow which was is very nonpathognomic-he has not taken Any antibiotics for this, but has felt week. She's not been able to eat or drink much more so he can has difficulty swallowing. He has no cough, no diarrhea. No shortness breath no blurred vision, no double vision. No dysuria, no hematuria, no diarrhea. No ill contacts, no blurred vision, no double vision. No weakness in any one side of body   Review of Systems: The patient denies contact with anyone who has been sick, outside food, dysentery, hematemesis, melena. He does have a very nonspecific rash  History reviewed. No pertinent past medical history. History reviewed. No pertinent past surgical history. Social History:  reports that he has quit smoking. He does not have any smokeless tobacco history on file. He reports that he does not drink alcohol. His drug history is not on file. Lives at home with his wife  No Known Allergies  History reviewed. No pertinent family history. patient and sure  Prior to Admission medications   Medication Sig Start Date End Date Taking? Authorizing Provider  acetaminophen (TYLENOL) 500 MG tablet Take 500 mg by mouth every 6 (six) hours as needed for pain.   Yes Historical Provider, MD  ibuprofen (ADVIL,MOTRIN) 200 MG tablet Take 600 mg by mouth every 6 (six) hours as needed for pain.   Yes Historical Provider, MD   Physical Exam: Filed Vitals:   05/08/13 2141 05/09/13 0047 05/09/13 0524 05/09/13 0640  BP: 130/77  123/75   Pulse: 103  104   Temp: 102.2 F (39  C) 99 F (37.2 C) 102.9 F (39.4 C) 99.4 F (37.4 C)  TempSrc: Oral Oral Oral Oral  Resp: 18  18   Height:      Weight:      SpO2: 96%  94%      General:  Alert wasn't oriented   Eyes: Left tonsil seems enlarged. There is some submandibular lymphadenopathy which is somewhat painful. No thyromegaly   ENT: Moderate dentition   Neck: Soft supple, no bruit in ski sent   Cardiovascular: S1, S2 no murmur, rub or gallop. Tachycardic   Respiratory: Clinically clear   Abdomen: Soft, nontender, no rebound no guarding   Skin: Rash on left elbow. Small punctate lesions over the rest body, which are very nonspecific and scattered   Musculoskeletal: Range of motion   Psychiatric: Euthymic   Neurologic: Grossly intact  Labs on Admission:  Basic Metabolic Panel:  Recent Labs Lab 05/04/13 2230 05/05/13 0430 05/06/13 0445 05/08/13 0503 05/09/13 0515  NA 127* 131* 139 136 136  K 3.4* 3.5 3.7 3.2* 3.3*  CL 91* 98 104 100 99  CO2 25 24 26 25 27   GLUCOSE 102* 127* 111* 127* 104*  BUN 11 9 6  5* 6  CREATININE 0.94 0.85 0.82 0.74 0.71  CALCIUM 8.2* 7.3* 8.1* 8.1* 8.3*   Liver Function Tests:  Recent Labs Lab 05/04/13 2230 05/05/13 0430  AST 56* 50*  ALT 19 14  ALKPHOS 62 51  BILITOT 0.3 0.2*  PROT 7.2 6.0  ALBUMIN 3.2* 2.6*  Recent Labs Lab 05/04/13 2230  LIPASE 29   No results found for this basename: AMMONIA,  in the last 168 hours CBC:  Recent Labs Lab 05/04/13 2230 05/05/13 0430 05/08/13 0503 05/09/13 0515  WBC 6.8 6.7 11.4* 12.9*  NEUTROABS 5.5  --   --   --   HGB 15.5 12.9* 13.6 13.9  HCT 42.4 36.2* 37.9* 39.3  MCV 83.8 84.4 83.7 83.3  PLT 134* 150 184 210   Cardiac Enzymes: No results found for this basename: CKTOTAL, CKMB, CKMBINDEX, TROPONINI,  in the last 168 hours  BNP (last 3 results) No results found for this basename: PROBNP,  in the last 8760 hours CBG: No results found for this basename: GLUCAP,  in the last 168  hours  Radiological Exams on Admission: No results found.  EKG: Independently reviewed. None performed  Assessment/Plan Active Problems:   Fever   Hyponatremia   Tachycardia   Rash   PNA (pneumonia)   1. Fever-source, most likely a tonsillitis. Less likely, is potentially [facility] spotted fever-given his history of rash, however, I cannot completely rule this out. Chest x-ray was equivocal and my reading for pneumonia, and I would not treat this as community-acquired pneumonia. start him on doxycycline 100 twice a day, by mouth, which can be completed in about 10 days and will hopefully treat both sources. He initially wanted to go home, but thinks it is better, if he stays in the hospital, which is very reasonable as he is somewhat tachycardic  2. Hyponatremia probably from T. postpartum 80 and not eating much. IV fluids 100 cc hour overnight. Expect this will resolve. Repeat labs in a.m. 3. Tachycardia, probably secondary #1. Will need IV fluids overnight as this is likely responsible same. Low threshold think there is any other source   4. Rash-see above      Code Status: Full  Family Communication: Discussed with wife at bedside   Disposition Plan: Obs, tele inpatient  Time spent: 74  Rhetta Mura [facility] Pager [phone]  If 7PM-7AM, please contact night-coverage [email or web address] Password [facility] 05/09/2013, 11:49 AM

## 2013-05-09 NOTE — Progress Notes (Addendum)
[facility] PROGRESS NOTE  Benjamin Fitzpatrick WUJ:[id number] DOB: November 09, 1985 DOA: 05/04/2013 PCP: No primary provider on file.  Assessment/Plan: Fever/PNA -Agree with rocephin/azithro. -Repeat CXR confirms infiltrate. -Upon history, he states for 10 days at home he had fever, chest pain with inspiration and difficulty breathing. -Will treat as CAP for now. -See no reason to investigate PE (low probability by Wells Criteria). -Had increased temps overnight. -Will check CT chest to make sure he is not developing and empyema.  Hyponatremia -Hypovolemic. -Resolved with IVF.  Hypokalemia -Replete PO.  Code Status: Full code Family Communication: Patient inly  Disposition Plan: Home when medically stable,.   Consultants:  None   Antibiotics:  Rocephin  Azithromycin   Subjective: Still with CP with inspiration. Has been febrile with a Tmax of 102. Complains of throat pain.  Objective: Filed Vitals:   05/08/13 2141 05/09/13 0047 05/09/13 0524 05/09/13 0640  BP: 130/77  123/75   Pulse: 103  104   Temp: 102.2 F (39 C) 99 F (37.2 C) 102.9 F (39.4 C) 99.4 F (37.4 C)  TempSrc: Oral Oral Oral Oral  Resp: 18  18   Height:      Weight:      SpO2: 96%  94%     Intake/Output Summary (Last 24 hours) at 05/09/13 1237 Last data filed at 05/09/13 0600  Gross per 24 hour  Intake   1750 ml  Output      2 ml  Net   1748 ml   Filed Weights   05/05/13 0210  Weight: 63.8 kg (140 lb 10.5 oz)    Exam:   General:  AA Ox3  Cardiovascular: RRR, no M/R/G  Respiratory: CTA B, no thrush  Abdomen: S/NT/ND/+BS/no masses or organomegaly noted.  Extremities: no C/C/E/+pedal pulses.   Neurologic:  Grossly intact and non-focal.  Data Reviewed: Basic Metabolic Panel:  Recent Labs Lab 05/04/13 2230 05/05/13 0430 05/06/13 0445 05/08/13 0503 05/09/13 0515  NA 127* 131* 139 136 136  K 3.4* 3.5 3.7 3.2* 3.3*  CL 91* 98 104 100 99  CO2 25 24 26 25 27   GLUCOSE 102*  127* 111* 127* 104*  BUN 11 9 6  5* 6  CREATININE 0.94 0.85 0.82 0.74 0.71  CALCIUM 8.2* 7.3* 8.1* 8.1* 8.3*   Liver Function Tests:  Recent Labs Lab 05/04/13 2230 05/05/13 0430  AST 56* 50*  ALT 19 14  ALKPHOS 62 51  BILITOT 0.3 0.2*  PROT 7.2 6.0  ALBUMIN 3.2* 2.6*    Recent Labs Lab 05/04/13 2230  LIPASE 29   No results found for this basename: AMMONIA,  in the last 168 hours CBC:  Recent Labs Lab 05/04/13 2230 05/05/13 0430 05/08/13 0503 05/09/13 0515  WBC 6.8 6.7 11.4* 12.9*  NEUTROABS 5.5  --   --   --   HGB 15.5 12.9* 13.6 13.9  HCT 42.4 36.2* 37.9* 39.3  MCV 83.8 84.4 83.7 83.3  PLT 134* 150 184 210   Cardiac Enzymes: No results found for this basename: CKTOTAL, CKMB, CKMBINDEX, TROPONINI,  in the last 168 hours BNP (last 3 results) No results found for this basename: PROBNP,  in the last 8760 hours CBG: No results found for this basename: GLUCAP,  in the last 168 hours  Recent Results (from the past 240 hour(s))  URINE CULTURE     Status: None   Collection Time    05/04/13  9:40 PM      Result Value Range Status   Specimen Description  URINE, RANDOM   Final   Special Requests NONE   Final   Culture  Setup Time 05/05/2013 11:37   Final   Colony Count 2,000 COLONIES/ML   Final   Culture INSIGNIFICANT GROWTH   Final   Report Status 05/06/2013 FINAL   Final  CULTURE, BLOOD (ROUTINE X 2)     Status: None   Collection Time    05/04/13 10:30 PM      Result Value Range Status   Specimen Description BLOOD LEFT ANTECUBITAL   Final   Special Requests BOTTLES DRAWN AEROBIC AND ANAEROBIC 5CC   Final   Culture  Setup Time 05/05/2013 10:42   Final   Culture     Final   Value:        BLOOD CULTURE RECEIVED NO GROWTH TO DATE CULTURE WILL BE HELD FOR 5 DAYS BEFORE ISSUING A FINAL NEGATIVE REPORT   Report Status PENDING   Incomplete  CULTURE, BLOOD (ROUTINE X 2)     Status: None   Collection Time    05/04/13 11:00 PM      Result Value Range Status    Specimen Description BLOOD RIGHT ANTECUBITAL   Final   Special Requests BOTTLES DRAWN AEROBIC AND ANAEROBIC 5CC   Final   Culture  Setup Time 05/05/2013 10:42   Final   Culture     Final   Value:        BLOOD CULTURE RECEIVED NO GROWTH TO DATE CULTURE WILL BE HELD FOR 5 DAYS BEFORE ISSUING A FINAL NEGATIVE REPORT   Report Status PENDING   Incomplete     Studies: No results found.  Scheduled Meds: . azithromycin  500 mg Intravenous Q24H  . cefTRIAXone (ROCEPHIN)  IV  1 g Intravenous Q24H  . feeding supplement  237 mL Oral BID BM   Continuous Infusions: . sodium chloride 100 mL/hr at 05/09/13 0408    Active Problems:   PNA (pneumonia)   Fever   Hyponatremia   Tachycardia   Rash   Hypokalemia    Time spent: 40 minutes.    Chaya Jan  [facility] Pager [phone]  If 7PM-7AM, please contact night-coverage at [email or web address], password [facility] 05/09/2013, 12:37 PM  LOS: 5 days

## 2013-05-10 DIAGNOSIS — J039 Acute tonsillitis, unspecified: Secondary | ICD-10-CM

## 2013-05-10 LAB — CBC
HCT: 35.9 % — ABNORMAL LOW (ref 39.0–52.0)
Platelets: 220 10*3/uL (ref 150–400)
RDW: 12.4 % (ref 11.5–15.5)
WBC: 11.6 10*3/uL — ABNORMAL HIGH (ref 4.0–10.5)

## 2013-05-10 LAB — BASIC METABOLIC PANEL
Chloride: 100 mEq/L (ref 96–112)
Creatinine, Ser: 0.69 mg/dL (ref 0.50–1.35)
GFR calc Af Amer: >90 mL/min (ref >90)
GFR calc non Af Amer: >90 mL/min (ref >90)

## 2013-05-10 MED ORDER — AMOXICILLIN-POT CLAVULANATE 875-125 MG PO TABS
1.0000 | ORAL_TABLET | Freq: Two times a day (BID) | ORAL | Status: DC
  Administered 2013-05-10 – 2013-05-13 (×7): 1 via ORAL
  Filled 2013-05-10 (×8): qty 1

## 2013-05-10 NOTE — Progress Notes (Signed)
[facility] PROGRESS NOTE  Benjamin Fitzpatrick WUJ:[id number] DOB: 1986/09/01 DOA: 05/04/2013 PCP: No primary provider on file.  Assessment/Plan: Fever/Tonsilitis -Initially thought to have PNA. -Because of continued fevers despite abx therapy, CT scan was ordered that showed no evidence of PNA and lymph nodes that were likely reactive. -HIV was negative. -He was complaining of sore throat. Pharyngeal exam shows marked pharyngeal erythema and enlarged tonsils. -Should have been adequately covered with rocephin. -Will transition abx to PO augmentin and monitor in the hospital for resolution of fever, with the hope of possible DC home in am.  Hyponatremia -Hypovolemic. -Resolved with IVF.  Hypokalemia -Repleted.  Code Status: Full code Family Communication: Patient and girlfriend at bedside. Disposition Plan: Home when medically stable, anticipate 24 hours.   Consultants:  None   Antibiotics:  Augmentin  Subjective: Still with CP with inspiration. Has been febrile with a Tmax of 100.5. Complains of throat pain.  Objective: Filed Vitals:   05/09/13 1420 05/09/13 2200 05/10/13 0601 05/10/13 1053  BP:  156/96 120/76   Pulse:  94 90   Temp: 100 F (37.8 C) 100.4 F (38 C) 99.7 F (37.6 C) 100.7 F (38.2 C)  TempSrc:  Oral Oral Oral  Resp:  18 18   Height:      Weight:      SpO2:  96% 96%     Intake/Output Summary (Last 24 hours) at 05/10/13 1305 Last data filed at 05/10/13 0813  Gross per 24 hour  Intake 1873.33 ml  Output    404 ml  Net 1469.33 ml   Filed Weights   05/05/13 0210  Weight: 63.8 kg (140 lb 10.5 oz)    Exam:   General:  AA Ox3  Cardiovascular: RRR, no M/R/G  Respiratory: CTA B, no thrush. Has marked parhyngeal erythema and enlarged tonsils.  Abdomen: S/NT/ND/+BS/no masses or organomegaly noted.  Extremities: no C/C/E/+pedal pulses.   Neurologic:  Grossly intact and non-focal.  Data Reviewed: Basic Metabolic Panel:  Recent  Labs Lab 05/05/13 0430 05/06/13 0445 05/08/13 0503 05/09/13 0515 05/10/13 0540  NA 131* 139 136 136 135  K 3.5 3.7 3.2* 3.3* 3.6  CL 98 104 100 99 100  CO2 24 26 25 27 26   GLUCOSE 127* 111* 127* 104* 109*  BUN 9 6 5* 6 6  CREATININE 0.85 0.82 0.74 0.71 0.69  CALCIUM 7.3* 8.1* 8.1* 8.3* 8.2*   Liver Function Tests:  Recent Labs Lab 05/04/13 2230 05/05/13 0430  AST 56* 50*  ALT 19 14  ALKPHOS 62 51  BILITOT 0.3 0.2*  PROT 7.2 6.0  ALBUMIN 3.2* 2.6*    Recent Labs Lab 05/04/13 2230  LIPASE 29   No results found for this basename: AMMONIA,  in the last 168 hours CBC:  Recent Labs Lab 05/04/13 2230 05/05/13 0430 05/08/13 0503 05/09/13 0515 05/10/13 0540  WBC 6.8 6.7 11.4* 12.9* 11.6*  NEUTROABS 5.5  --   --   --   --   HGB 15.5 12.9* 13.6 13.9 12.6*  HCT 42.4 36.2* 37.9* 39.3 35.9*  MCV 83.8 84.4 83.7 83.3 83.5  PLT 134* 150 184 210 220   Cardiac Enzymes: No results found for this basename: CKTOTAL, CKMB, CKMBINDEX, TROPONINI,  in the last 168 hours BNP (last 3 results) No results found for this basename: PROBNP,  in the last 8760 hours CBG: No results found for this basename: GLUCAP,  in the last 168 hours  Recent Results (from the past 240 hour(s))  URINE CULTURE  Status: None   Collection Time    05/04/13  9:40 PM      Result Value Range Status   Specimen Description URINE, RANDOM   Final   Special Requests NONE   Final   Culture  Setup Time 05/05/2013 11:37   Final   Colony Count 2,000 COLONIES/ML   Final   Culture INSIGNIFICANT GROWTH   Final   Report Status 05/06/2013 FINAL   Final  CULTURE, BLOOD (ROUTINE X 2)     Status: None   Collection Time    05/04/13 10:30 PM      Result Value Range Status   Specimen Description BLOOD LEFT ANTECUBITAL   Final   Special Requests BOTTLES DRAWN AEROBIC AND ANAEROBIC 5CC   Final   Culture  Setup Time 05/05/2013 10:42   Final   Culture     Final   Value:        BLOOD CULTURE RECEIVED NO GROWTH TO  DATE CULTURE WILL BE HELD FOR 5 DAYS BEFORE ISSUING A FINAL NEGATIVE REPORT   Report Status PENDING   Incomplete  CULTURE, BLOOD (ROUTINE X 2)     Status: None   Collection Time    05/04/13 11:00 PM      Result Value Range Status   Specimen Description BLOOD RIGHT ANTECUBITAL   Final   Special Requests BOTTLES DRAWN AEROBIC AND ANAEROBIC 5CC   Final   Culture  Setup Time 05/05/2013 10:42   Final   Culture     Final   Value:        BLOOD CULTURE RECEIVED NO GROWTH TO DATE CULTURE WILL BE HELD FOR 5 DAYS BEFORE ISSUING A FINAL NEGATIVE REPORT   Report Status PENDING   Incomplete     Studies: Ct Chest W Contrast  05/09/2013   *RADIOLOGY REPORT*  Clinical Data fever, chills, evaluate for pneumonia.  CT CHEST WITH CONTRAST  Technique:  Multidetector CT imaging of the chest was performed following the standard protocol during bolus administration of intravenous contrast.  Contrast: 80mL OMNIPAQUE IOHEXOL 300 MG/ML  SOLN  Comparison: Chest x-ray May 07, 2013  Findings: There is a minimal left pleural effusion.  There are mild atelectasis of the medial bilateral lung bases.  The atelectasis in the medial left lung base correlates to the CT finding.  There is no focal pulmonary nodule or mass.  There is no pulmonary edema. The mediastinal windows demonstrate abnormal soft tissue in the anterior mediastinum at least in part due to enlarged lymph nodes largest measures 2 x 1.8 cm.  Residual planus tissue may account for some of the soft tissues in the anterior mediastinum.  There is no hilar lymphadenopathy.  The heart size is normal.  The visualized upper abdominal structures are normal.  The bones are normal.  IMPRESSION: Atelectasis of the medial lung bases correlating to the chest x-ray finding.  Minimal left pleural effusion.  There is no focal pneumonia.  Abnormal soft tissue in the anterior mediastinum at least in part due to residual thymus but there are abnormal enlarged mediastinal lymph nodes  identified. The enlarged mediastinal lymph nodes are probably  reactive lymph nodes but neoplastic etiology such as lymphoma is not excluded.   Original Report Authenticated By: Sherian Rein, M.D.    Scheduled Meds: . amoxicillin-clavulanate  1 tablet Oral Q12H  . feeding supplement  237 mL Oral BID BM   Continuous Infusions: . sodium chloride 100 mL/hr at 05/10/13 1052    Principal Problem:  Tonsillitis Active Problems:   Fever   Hyponatremia   Tachycardia   Rash   Hypokalemia    Time spent: 40 minutes.    Chaya Jan  [facility] Pager [phone]  If 7PM-7AM, please contact night-coverage at [email or web address], password [facility] 05/10/2013, 1:05 PM  LOS: 6 days

## 2013-05-11 ENCOUNTER — Inpatient Hospital Stay (HOSPITAL_COMMUNITY): Payer: Medicaid Other

## 2013-05-11 ENCOUNTER — Encounter (HOSPITAL_COMMUNITY): Payer: Self-pay | Admitting: Radiology

## 2013-05-11 LAB — CULTURE, BLOOD (ROUTINE X 2): Culture: NO GROWTH

## 2013-05-11 LAB — CBC
Hemoglobin: 13.2 g/dL (ref 13.0–17.0)
MCH: 29.3 pg (ref 26.0–34.0)
RBC: 4.5 MIL/uL (ref 4.22–5.81)
WBC: 13.5 10*3/uL — ABNORMAL HIGH (ref 4.0–10.5)

## 2013-05-11 LAB — SEDIMENTATION RATE: Sed Rate: 64 mm/hr — ABNORMAL HIGH (ref 0–16)

## 2013-05-11 LAB — URINALYSIS, ROUTINE W REFLEX MICROSCOPIC
Glucose, UA: NEGATIVE mg/dL
Hgb urine dipstick: NEGATIVE
Leukocytes, UA: NEGATIVE
Specific Gravity, Urine: 1.01 (ref 1.005–1.030)
pH: 7 (ref 5.0–8.0)

## 2013-05-11 LAB — LACTATE DEHYDROGENASE: LDH: 510 U/L — ABNORMAL HIGH (ref 94–250)

## 2013-05-11 LAB — C-REACTIVE PROTEIN: CRP: 17.1 mg/dL — ABNORMAL HIGH (ref <0.60)

## 2013-05-11 MED ORDER — IOHEXOL 300 MG/ML  SOLN
100.0000 mL | Freq: Once | INTRAMUSCULAR | Status: AC | PRN
  Administered 2013-05-11: 100 mL via INTRAVENOUS

## 2013-05-11 MED ORDER — IOHEXOL 300 MG/ML  SOLN
50.0000 mL | Freq: Once | INTRAMUSCULAR | Status: AC | PRN
  Administered 2013-05-11: 50 mL via ORAL

## 2013-05-11 NOTE — Progress Notes (Signed)
[facility] PROGRESS NOTE  Benjamin Fitzpatrick YQM:[id number] DOB: [birth year] DOA: 05/04/2013 PCP: No primary provider on file.  Assessment/Plan: FUO -Initially thought to have PNA. -Because of continued fevers despite abx therapy, CT scan was ordered that showed no evidence of PNA and lymph nodes that were likely reactive. -HIV was negative. -Continue on PO augmentin. -Appreciate ID input. ECHO, ANA, HIV viral load, hepatitis panel ordered and pending.  Hyponatremia -Hypovolemic. -Resolved with IVF.  Hypokalemia -Repleted.  Code Status: Full code Family Communication: Patient only Disposition Plan: Home when medically stable.   Consultants:  ID, Dr. Luciana Axe   Antibiotics:  Augmentin  Subjective: Remains febrile. No complaints other than sore throat.  Objective: Filed Vitals:   05/10/13 1550 05/10/13 2200 05/10/13 2227 05/11/13 0600  BP:  138/81  126/74  Pulse:  107  100  Temp: 99.8 F (37.7 C) 103.1 F (39.5 C) 100.4 F (38 C) 101.1 F (38.4 C)  TempSrc: Oral Oral Oral Oral  Resp:  20  20  Height:      Weight:      SpO2:  96%  95%    Intake/Output Summary (Last 24 hours) at 05/11/13 1254 Last data filed at 05/11/13 1007  Gross per 24 hour  Intake 1113.33 ml  Output    400 ml  Net 713.33 ml   Filed Weights   05/05/13 0210  Weight: 63.8 kg (140 lb 10.5 oz)    Exam:   General:  AA Ox3  Cardiovascular: RRR, no M/R/G  Respiratory: CTA B, no thrush. Has marked parhyngeal erythema.  Abdomen: S/NT/ND/+BS/no masses or organomegaly noted.  Extremities: no C/C/E/+pedal pulses.   Neurologic:  Grossly intact and non-focal.  Data Reviewed: Basic Metabolic Panel:  Recent Labs Lab 05/05/13 0430 05/06/13 0445 05/08/13 0503 05/09/13 0515 05/10/13 0540  NA 131* 139 136 136 135  K 3.5 3.7 3.2* 3.3* 3.6  CL 98 104 100 99 100  CO2 24 26 25 27 26   GLUCOSE 127* 111* 127* 104* 109*  BUN 9 6 5* 6 6  CREATININE 0.85 0.82 0.74 0.71 0.69  CALCIUM 7.3*  8.1* 8.1* 8.3* 8.2*   Liver Function Tests:  Recent Labs Lab 05/04/13 2230 05/05/13 0430  AST 56* 50*  ALT 19 14  ALKPHOS 62 51  BILITOT 0.3 0.2*  PROT 7.2 6.0  ALBUMIN 3.2* 2.6*    Recent Labs Lab 05/04/13 2230  LIPASE 29   No results found for this basename: AMMONIA,  in the last 168 hours CBC:  Recent Labs Lab 05/04/13 2230 05/05/13 0430 05/08/13 0503 05/09/13 0515 05/10/13 0540 05/11/13 0543  WBC 6.8 6.7 11.4* 12.9* 11.6* 13.5*  NEUTROABS 5.5  --   --   --   --   --   HGB 15.5 12.9* 13.6 13.9 12.6* 13.2  HCT 42.4 36.2* 37.9* 39.3 35.9* 37.9*  MCV 83.8 84.4 83.7 83.3 83.5 84.2  PLT 134* 150 184 210 220 290   Cardiac Enzymes: No results found for this basename: CKTOTAL, CKMB, CKMBINDEX, TROPONINI,  in the last 168 hours BNP (last 3 results) No results found for this basename: PROBNP,  in the last 8760 hours CBG: No results found for this basename: GLUCAP,  in the last 168 hours  Recent Results (from the past 240 hour(s))  URINE CULTURE     Status: None   Collection Time    05/04/13  9:40 PM      Result Value Range Status   Specimen Description URINE, RANDOM   Final  Special Requests NONE   Final   Culture  Setup Time 05/05/2013 11:37   Final   Colony Count 2,000 COLONIES/ML   Final   Culture INSIGNIFICANT GROWTH   Final   Report Status 05/06/2013 FINAL   Final  CULTURE, BLOOD (ROUTINE X 2)     Status: None   Collection Time    05/04/13 10:30 PM      Result Value Range Status   Specimen Description BLOOD LEFT ANTECUBITAL   Final   Special Requests BOTTLES DRAWN AEROBIC AND ANAEROBIC 5CC   Final   Culture  Setup Time 05/05/2013 10:42   Final   Culture     Final   Value:        BLOOD CULTURE RECEIVED NO GROWTH TO DATE CULTURE WILL BE HELD FOR 5 DAYS BEFORE ISSUING A FINAL NEGATIVE REPORT   Report Status PENDING   Incomplete  CULTURE, BLOOD (ROUTINE X 2)     Status: None   Collection Time    05/04/13 11:00 PM      Result Value Range Status    Specimen Description BLOOD RIGHT ANTECUBITAL   Final   Special Requests BOTTLES DRAWN AEROBIC AND ANAEROBIC 5CC   Final   Culture  Setup Time 05/05/2013 10:42   Final   Culture     Final   Value:        BLOOD CULTURE RECEIVED NO GROWTH TO DATE CULTURE WILL BE HELD FOR 5 DAYS BEFORE ISSUING A FINAL NEGATIVE REPORT   Report Status PENDING   Incomplete     Studies: Ct Chest W Contrast  05/09/2013   *RADIOLOGY REPORT*  Clinical Data fever, chills, evaluate for pneumonia.  CT CHEST WITH CONTRAST  Technique:  Multidetector CT imaging of the chest was performed following the standard protocol during bolus administration of intravenous contrast.  Contrast: 80mL OMNIPAQUE IOHEXOL 300 MG/ML  SOLN  Comparison: Chest x-ray May 07, 2013  Findings: There is a minimal left pleural effusion.  There are mild atelectasis of the medial bilateral lung bases.  The atelectasis in the medial left lung base correlates to the CT finding.  There is no focal pulmonary nodule or mass.  There is no pulmonary edema. The mediastinal windows demonstrate abnormal soft tissue in the anterior mediastinum at least in part due to enlarged lymph nodes largest measures 2 x 1.8 cm.  Residual planus tissue may account for some of the soft tissues in the anterior mediastinum.  There is no hilar lymphadenopathy.  The heart size is normal.  The visualized upper abdominal structures are normal.  The bones are normal.  IMPRESSION: Atelectasis of the medial lung bases correlating to the chest x-ray finding.  Minimal left pleural effusion.  There is no focal pneumonia.  Abnormal soft tissue in the anterior mediastinum at least in part due to residual thymus but there are abnormal enlarged mediastinal lymph nodes identified. The enlarged mediastinal lymph nodes are probably  reactive lymph nodes but neoplastic etiology such as lymphoma is not excluded.   Original Report Authenticated By: Sherian Rein, M.D.   Ct Abdomen Pelvis W Contrast  05/11/2013    *RADIOLOGY REPORT*  Clinical Data: Fever, enlarged mediastinal lymph nodes on chest CT  CT ABDOMEN AND PELVIS WITH CONTRAST  Technique:  Multidetector CT imaging of the abdomen and pelvis was performed following the standard protocol during bolus administration of intravenous contrast.  Contrast: 100 ml Omnipaque-300 IV  Comparison: None.  Findings: Motion degraded images.  Trace bilateral pleural effusions,  left greater than right, with associated mild dependent atelectasis.  Liver, spleen, pancreas, and adrenal glands are within normal limits.  Gallbladder is underdistended.  No intrahepatic or extrahepatic ductal dilatation.  Kidneys are within normal limits.  No hydronephrosis.  No evidence of bowel obstruction.  Normal appendix.  Left colon is underdistended.  No evidence of abdominal aortic aneurysm.  Trace pelvic ascites (series 2/image 77).  No suspicious abdominopelvic lymphadenopathy.  Prostate is unremarkable.  Bladder is within normal limits.  Visualized osseous structures are within normal limits.  IMPRESSION: Motion degraded images.  No suspicious abdominopelvic lymphadenopathy.  Trace pelvic ascites.  Trace bilateral pleural effusions, left greater than right, with associated dependent atelectasis.   Original Report Authenticated By: Charline Bills, M.D.    Scheduled Meds: . amoxicillin-clavulanate  1 tablet Oral Q12H  . feeding supplement  237 mL Oral BID BM   Continuous Infusions: . sodium chloride 100 mL/hr at 05/11/13 0754    Principal Problem:   Fever Active Problems:   Hyponatremia   Tachycardia   Rash   Hypokalemia    Time spent: 40 minutes.    Chaya Jan  [facility] Pager [phone]  If 7PM-7AM, please contact night-coverage at [email or web address], password [facility] 05/11/2013, 12:54 PM  LOS: 7 days

## 2013-05-11 NOTE — Consult Note (Signed)
[facility]     Reason for Consult: fever    Referring Physician: Dr. Philip Aspen  Principal Problem:   Tonsillitis Active Problems:   Fever   Hyponatremia   Tachycardia   Rash   Hypokalemia   . amoxicillin-clavulanate  1 tablet Oral Q12H  . feeding supplement  237 mL Oral BID BM    Recommendations: Repeat blood cultures for second set today Echo HIV viral load Ana Hepatitis panel LDH spep   Assessment: He has a persistent fever with no obvious source.  He has had a sore throat, mediastinal and submandibular lad (based on initial exam).  No findings on CT abdomen.  Mild AST elevation and low protein with electrolyte abnormalities. + murmur (?new).  Has not been back to [place] for the 10 years he has been here continuously in [place].  No other symptoms. DDx includes acute HIV, endocarditis, viral, non infectious (lymphoma, rheumatological).     Antibiotics: augmentin  HPI: Benjamin Fitzpatrick is a 27 y.o. male previously healthy and originally from [place] who presented on 8/1 with chills, left sided chest pain and rash he says was on his left knee area (and resolved) and poor appetite.  No cough, no diarrhea, no weight loss, no SOB.  + sore throat and is improving.  No sick contacts.     Review of Systems: A comprehensive review of systems was negative.  History reviewed. No pertinent past medical history.  History  Substance Use Topics  . Smoking status: Former Games developer  . Smokeless tobacco: Not on file  . Alcohol Use: No    History reviewed. No pertinent family history. No Known Allergies  OBJECTIVE: Blood pressure 126/74, pulse 100, temperature 101.1 F (38.4 C), temperature source Oral, resp. rate 20, height 5\' 6"  (1.676 m), weight 140 lb 10.5 oz (63.8 kg), SpO2 95.00%. General: Awake, alert, nad Skin: no rashes Lungs: CTA B Cor: RRR + 3-4/6 SEM Abdomen: soft, nt, nd, +bs, no HSM Ext: no edema MS: no joint effusion Skin: no  rash HEENT: throat with petechiae, no swelling LAD: no cervical, no submandibular lad noted  Microbiology: Recent Results (from the past 240 hour(s))  URINE CULTURE     Status: None   Collection Time    05/04/13  9:40 PM      Result Value Range Status   Specimen Description URINE, RANDOM   Final   Special Requests NONE   Final   Culture  Setup Time 05/05/2013 11:37   Final   Colony Count 2,000 COLONIES/ML   Final   Culture INSIGNIFICANT GROWTH   Final   Report Status 05/06/2013 FINAL   Final  CULTURE, BLOOD (ROUTINE X 2)     Status: None   Collection Time    05/04/13 10:30 PM      Result Value Range Status   Specimen Description BLOOD LEFT ANTECUBITAL   Final   Special Requests BOTTLES DRAWN AEROBIC AND ANAEROBIC 5CC   Final   Culture  Setup Time 05/05/2013 10:42   Final   Culture     Final   Value:        BLOOD CULTURE RECEIVED NO GROWTH TO DATE CULTURE WILL BE HELD FOR 5 DAYS BEFORE ISSUING A FINAL NEGATIVE REPORT   Report Status PENDING   Incomplete  CULTURE, BLOOD (ROUTINE X 2)     Status: None   Collection Time    05/04/13 11:00 PM      Result Value Range Status  Specimen Description BLOOD RIGHT ANTECUBITAL   Final   Special Requests BOTTLES DRAWN AEROBIC AND ANAEROBIC 5CC   Final   Culture  Setup Time 05/05/2013 10:42   Final   Culture     Final   Value:        BLOOD CULTURE RECEIVED NO GROWTH TO DATE CULTURE WILL BE HELD FOR 5 DAYS BEFORE ISSUING A FINAL NEGATIVE REPORT   Report Status PENDING   Incomplete    Enoc Getter, Molly Maduro, MD [facility] [email or web address] [phone] pager  [phone] cell 05/11/2013, 12:01 PM

## 2013-05-12 ENCOUNTER — Inpatient Hospital Stay (HOSPITAL_COMMUNITY): Payer: Medicaid Other

## 2013-05-12 DIAGNOSIS — R011 Cardiac murmur, unspecified: Secondary | ICD-10-CM

## 2013-05-12 LAB — COMPREHENSIVE METABOLIC PANEL
AST: 38 U/L — ABNORMAL HIGH (ref 0–37)
Albumin: 2.3 g/dL — ABNORMAL LOW (ref 3.5–5.2)
Alkaline Phosphatase: 55 U/L (ref 39–117)
BUN: 7 mg/dL (ref 6–23)
Potassium: 3.4 mEq/L — ABNORMAL LOW (ref 3.5–5.1)
Total Protein: 6.3 g/dL (ref 6.0–8.3)

## 2013-05-12 LAB — HEPATITIS PANEL, ACUTE
HCV Ab: NEGATIVE
Hep A IgM: NEGATIVE
Hep B C IgM: NEGATIVE
Hepatitis B Surface Ag: NEGATIVE

## 2013-05-12 LAB — HIV-1 RNA QUANT-NO REFLEX-BLD: HIV-1 RNA Quant, Log: <1.3 {Log} (ref <1.30)

## 2013-05-12 LAB — CBC
HCT: 35.2 % — ABNORMAL LOW (ref 39.0–52.0)
MCHC: 34.7 g/dL (ref 30.0–36.0)
Platelets: 326 10*3/uL (ref 150–400)
RDW: 12.5 % (ref 11.5–15.5)

## 2013-05-12 LAB — URINE CULTURE: Colony Count: NO GROWTH

## 2013-05-12 LAB — EHRLICHIA ANTIBODY PANEL: E chaffeensis (HGE) Ab, IgM: <1:20 {titer}

## 2013-05-12 MED ORDER — IOHEXOL 300 MG/ML  SOLN
100.0000 mL | Freq: Once | INTRAMUSCULAR | Status: AC | PRN
  Administered 2013-05-12: 100 mL via INTRAVENOUS

## 2013-05-12 NOTE — Progress Notes (Signed)
[facility]  Date of Admission:  05/04/2013  Antibiotics: Augmentin  Subjective: No new complaints Remains febrile  Objective: Temp:  [99.3 F (37.4 C)-101 F (38.3 C)] 99.3 F (37.4 C) (08/04 0628) Pulse Rate:  [102-104] 102 (08/04 0600) Resp:  [18] 18 (08/04 0600) BP: (108-135)/(68-71) 121/70 mmHg (08/04 0600) SpO2:  [95 %-96 %] 95 % (08/04 0600)  General: Awake, alert, nad Skin: no rashes Lungs: CTA B Cor: RRR w SEM Abdomen: soft, nt, nd, +bs Ext: no edema  Lab Results Lab Results  Component Value Date   WBC 13.0* 05/12/2013   HGB 12.2* 05/12/2013   HCT 35.2* 05/12/2013   MCV 83.8 05/12/2013   PLT 326 05/12/2013    Lab Results  Component Value Date   CREATININE 0.67 05/12/2013   BUN 7 05/12/2013   NA 132* 05/12/2013   K 3.4* 05/12/2013   CL 97 05/12/2013   CO2 24 05/12/2013    Lab Results  Component Value Date   ALT 28 05/12/2013   AST 38* 05/12/2013   ALKPHOS 55 05/12/2013   BILITOT 0.3 05/12/2013      Microbiology: Recent Results (from the past 240 hour(s))  URINE CULTURE     Status: None   Collection Time    05/04/13  9:40 PM      Result Value Range Status   Specimen Description URINE, RANDOM   Final   Special Requests NONE   Final   Culture  Setup Time 05/05/2013 11:37   Final   Colony Count 2,000 COLONIES/ML   Final   Culture INSIGNIFICANT GROWTH   Final   Report Status 05/06/2013 FINAL   Final  CULTURE, BLOOD (ROUTINE X 2)     Status: None   Collection Time    05/04/13 10:30 PM      Result Value Range Status   Specimen Description BLOOD LEFT ANTECUBITAL   Final   Special Requests BOTTLES DRAWN AEROBIC AND ANAEROBIC 5CC   Final   Culture  Setup Time 05/05/2013 10:42   Final   Culture NO GROWTH 5 DAYS   Final   Report Status 05/11/2013 FINAL   Final  CULTURE, BLOOD (ROUTINE X 2)     Status: None   Collection Time    05/04/13 11:00 PM      Result Value Range Status   Specimen Description BLOOD RIGHT ANTECUBITAL   Final   Special  Requests BOTTLES DRAWN AEROBIC AND ANAEROBIC 5CC   Final   Culture  Setup Time 05/05/2013 10:42   Final   Culture NO GROWTH 5 DAYS   Final   Report Status 05/11/2013 FINAL   Final  CULTURE, BLOOD (ROUTINE X 2)     Status: None   Collection Time    05/11/13  8:35 AM      Result Value Range Status   Specimen Description BLOOD RIGHT HAND   Final   Special Requests BOTTLES DRAWN AEROBIC AND ANAEROBIC 5CC EACH   Final   Culture  Setup Time 05/11/2013 15:54   Final   Culture     Final   Value:        BLOOD CULTURE RECEIVED NO GROWTH TO DATE CULTURE WILL BE HELD FOR 5 DAYS BEFORE ISSUING A FINAL NEGATIVE REPORT   Report Status PENDING   Incomplete  CULTURE, BLOOD (ROUTINE X 2)     Status: None   Collection Time    05/11/13  8:48 AM      Result  Value Range Status   Specimen Description BLOOD LEFT ARM   Final   Special Requests BOTTLES DRAWN AEROBIC AND ANAEROBIC 5CC EACH   Final   Culture  Setup Time 05/11/2013 15:54   Final   Culture     Final   Value:        BLOOD CULTURE RECEIVED NO GROWTH TO DATE CULTURE WILL BE HELD FOR 5 DAYS BEFORE ISSUING A FINAL NEGATIVE REPORT   Report Status PENDING   Incomplete    Studies/Results: Ct Abdomen Pelvis W Contrast  05/11/2013   *RADIOLOGY REPORT*  Clinical Data: Fever, enlarged mediastinal lymph nodes on chest CT  CT ABDOMEN AND PELVIS WITH CONTRAST  Technique:  Multidetector CT imaging of the abdomen and pelvis was performed following the standard protocol during bolus administration of intravenous contrast.  Contrast: 100 ml Omnipaque-300 IV  Comparison: None.  Findings: Motion degraded images.  Trace bilateral pleural effusions, left greater than right, with associated mild dependent atelectasis.  Liver, spleen, pancreas, and adrenal glands are within normal limits.  Gallbladder is underdistended.  No intrahepatic or extrahepatic ductal dilatation.  Kidneys are within normal limits.  No hydronephrosis.  No evidence of bowel obstruction.  Normal appendix.   Left colon is underdistended.  No evidence of abdominal aortic aneurysm.  Trace pelvic ascites (series 2/image 77).  No suspicious abdominopelvic lymphadenopathy.  Prostate is unremarkable.  Bladder is within normal limits.  Visualized osseous structures are within normal limits.  IMPRESSION: Motion degraded images.  No suspicious abdominopelvic lymphadenopathy.  Trace pelvic ascites.  Trace bilateral pleural effusions, left greater than right, with associated dependent atelectasis.   Original Report Authenticated By: Charline Bills, M.D.    Assessment/Plan: 1) Fever without a source - no etiology identified.  Awaiting echo.  To get neck CT.  He has some mediastinal findings, ? Ludwig's.  Will follow CT.   -he does not know of childhood vaccines, whether or not he has had them, but no signs of vaccine preventable disease at this time.    Staci Righter, MD [facility] [email or web address] [phone] pager   [phone] cell 05/12/2013, 11:40 AM

## 2013-05-12 NOTE — Progress Notes (Signed)
[facility] PROGRESS NOTE  Benjamin Fitzpatrick ZOX:[id number] DOB: 07/05/1986 DOA: 05/04/2013 PCP: No primary provider on file.  Assessment/Plan: FUO -Initially thought to have PNA. -Because of continued fevers despite abx therapy, CT scan chest was ordered that showed no evidence of PNA and lymph nodes that were likely reactive. -HIV was negative. -Continue on PO augmentin. -Appreciate ID input.  -ECHO pending. -Will order CT neck as his only complaint is sore throat.  Hyponatremia -Hypovolemic. -Resolved with IVF.  Hypokalemia -Repleted.  Systolic murmur -New since admission (?) -?related to fever. -ECHO pending.  Code Status: Full code Family Communication: Patient only Disposition Plan: Home when medically stable.   Consultants:  ID, Dr. Luciana Axe   Antibiotics:  Augmentin  Subjective: Remains febrile. No complaints other than sore throat.  Objective: Filed Vitals:   05/11/13 2330 05/12/13 0600 05/12/13 0628 05/12/13 1335  BP:  121/70  120/70  Pulse:  102  117  Temp: 99.8 F (37.7 C) 101 F (38.3 C) 99.3 F (37.4 C) 100.9 F (38.3 C)  TempSrc: Oral Oral Axillary Oral  Resp:  18  18  Height:      Weight:      SpO2:  95%  93%    Intake/Output Summary (Last 24 hours) at 05/12/13 1424 Last data filed at 05/12/13 0600  Gross per 24 hour  Intake   1200 ml  Output    350 ml  Net    850 ml   Filed Weights   05/05/13 0210  Weight: 63.8 kg (140 lb 10.5 oz)    Exam:   General:  AA Ox3  Cardiovascular: RRR, no M/R/G  Respiratory: CTA B, no thrush. Has marked parhyngeal erythema.  Abdomen: S/NT/ND/+BS/no masses or organomegaly noted.  Extremities: no C/C/E/+pedal pulses.   Neurologic:  Grossly intact and non-focal.  Data Reviewed: Basic Metabolic Panel:  Recent Labs Lab 05/06/13 0445 05/08/13 0503 05/09/13 0515 05/10/13 0540 05/12/13 0505  NA 139 136 136 135 132*  K 3.7 3.2* 3.3* 3.6 3.4*  CL 104 100 99 100 97  CO2 26 25 27 26 24    GLUCOSE 111* 127* 104* 109* 106*  BUN 6 5* 6 6 7   CREATININE 0.82 0.74 0.71 0.69 0.67  CALCIUM 8.1* 8.1* 8.3* 8.2* 8.3*   Liver Function Tests:  Recent Labs Lab 05/12/13 0505  AST 38*  ALT 28  ALKPHOS 55  BILITOT 0.3  PROT 6.3  ALBUMIN 2.3*   No results found for this basename: LIPASE, AMYLASE,  in the last 168 hours No results found for this basename: AMMONIA,  in the last 168 hours CBC:  Recent Labs Lab 05/08/13 0503 05/09/13 0515 05/10/13 0540 05/11/13 0543 05/12/13 0505  WBC 11.4* 12.9* 11.6* 13.5* 13.0*  HGB 13.6 13.9 12.6* 13.2 12.2*  HCT 37.9* 39.3 35.9* 37.9* 35.2*  MCV 83.7 83.3 83.5 84.2 83.8  PLT 184 210 220 290 326   Cardiac Enzymes: No results found for this basename: CKTOTAL, CKMB, CKMBINDEX, TROPONINI,  in the last 168 hours BNP (last 3 results) No results found for this basename: PROBNP,  in the last 8760 hours CBG: No results found for this basename: GLUCAP,  in the last 168 hours  Recent Results (from the past 240 hour(s))  URINE CULTURE     Status: None   Collection Time    05/04/13  9:40 PM      Result Value Range Status   Specimen Description URINE, RANDOM   Final   Special Requests NONE   Final  Culture  Setup Time 05/05/2013 11:37   Final   Colony Count 2,000 COLONIES/ML   Final   Culture INSIGNIFICANT GROWTH   Final   Report Status 05/06/2013 FINAL   Final  CULTURE, BLOOD (ROUTINE X 2)     Status: None   Collection Time    05/04/13 10:30 PM      Result Value Range Status   Specimen Description BLOOD LEFT ANTECUBITAL   Final   Special Requests BOTTLES DRAWN AEROBIC AND ANAEROBIC 5CC   Final   Culture  Setup Time 05/05/2013 10:42   Final   Culture NO GROWTH 5 DAYS   Final   Report Status 05/11/2013 FINAL   Final  CULTURE, BLOOD (ROUTINE X 2)     Status: None   Collection Time    05/04/13 11:00 PM      Result Value Range Status   Specimen Description BLOOD RIGHT ANTECUBITAL   Final   Special Requests BOTTLES DRAWN AEROBIC AND  ANAEROBIC 5CC   Final   Culture  Setup Time 05/05/2013 10:42   Final   Culture NO GROWTH 5 DAYS   Final   Report Status 05/11/2013 FINAL   Final  CULTURE, BLOOD (ROUTINE X 2)     Status: None   Collection Time    05/11/13  8:35 AM      Result Value Range Status   Specimen Description BLOOD RIGHT HAND   Final   Special Requests BOTTLES DRAWN AEROBIC AND ANAEROBIC 5CC EACH   Final   Culture  Setup Time 05/11/2013 15:54   Final   Culture     Final   Value:        BLOOD CULTURE RECEIVED NO GROWTH TO DATE CULTURE WILL BE HELD FOR 5 DAYS BEFORE ISSUING A FINAL NEGATIVE REPORT   Report Status PENDING   Incomplete  CULTURE, BLOOD (ROUTINE X 2)     Status: None   Collection Time    05/11/13  8:48 AM      Result Value Range Status   Specimen Description BLOOD LEFT ARM   Final   Special Requests BOTTLES DRAWN AEROBIC AND ANAEROBIC 5CC EACH   Final   Culture  Setup Time 05/11/2013 15:54   Final   Culture     Final   Value:        BLOOD CULTURE RECEIVED NO GROWTH TO DATE CULTURE WILL BE HELD FOR 5 DAYS BEFORE ISSUING A FINAL NEGATIVE REPORT   Report Status PENDING   Incomplete     Studies: Ct Abdomen Pelvis W Contrast  05/11/2013   *RADIOLOGY REPORT*  Clinical Data: Fever, enlarged mediastinal lymph nodes on chest CT  CT ABDOMEN AND PELVIS WITH CONTRAST  Technique:  Multidetector CT imaging of the abdomen and pelvis was performed following the standard protocol during bolus administration of intravenous contrast.  Contrast: 100 ml Omnipaque-300 IV  Comparison: None.  Findings: Motion degraded images.  Trace bilateral pleural effusions, left greater than right, with associated mild dependent atelectasis.  Liver, spleen, pancreas, and adrenal glands are within normal limits.  Gallbladder is underdistended.  No intrahepatic or extrahepatic ductal dilatation.  Kidneys are within normal limits.  No hydronephrosis.  No evidence of bowel obstruction.  Normal appendix.  Left colon is underdistended.  No  evidence of abdominal aortic aneurysm.  Trace pelvic ascites (series 2/image 77).  No suspicious abdominopelvic lymphadenopathy.  Prostate is unremarkable.  Bladder is within normal limits.  Visualized osseous structures are within normal limits.  IMPRESSION:  Motion degraded images.  No suspicious abdominopelvic lymphadenopathy.  Trace pelvic ascites.  Trace bilateral pleural effusions, left greater than right, with associated dependent atelectasis.   Original Report Authenticated By: Charline Bills, M.D.    Scheduled Meds: . amoxicillin-clavulanate  1 tablet Oral Q12H  . feeding supplement  237 mL Oral BID BM   Continuous Infusions: . sodium chloride 100 mL/hr at 05/12/13 0538    Principal Problem:   Fever Active Problems:   Hyponatremia   Tachycardia   Rash   Hypokalemia    Time spent: 40 minutes.    Chaya Jan  [facility] Pager [phone]  If 7PM-7AM, please contact night-coverage at [email or web address], password [facility] 05/12/2013, 2:24 PM  LOS: 8 days

## 2013-05-12 NOTE — Progress Notes (Signed)
*  PRELIMINARY RESULTS* Echocardiogram 2D Echocardiogram has been performed.  Carolynne Schuchard 05/12/2013, 11:30 AM 

## 2013-05-13 LAB — PROTEIN ELECTROPHORESIS, SERUM
Albumin ELP: 40.6 % — ABNORMAL LOW (ref 55.8–66.1)
Alpha-1-Globulin: 13.6 % — ABNORMAL HIGH (ref 2.9–4.9)
Total Protein ELP: 6.4 g/dL (ref 6.0–8.3)

## 2013-05-13 LAB — CBC
HCT: 34.1 % — ABNORMAL LOW (ref 39.0–52.0)
MCV: 83.8 fL (ref 78.0–100.0)
RBC: 4.07 MIL/uL — ABNORMAL LOW (ref 4.22–5.81)
RDW: 12.5 % (ref 11.5–15.5)
WBC: 12.5 10*3/uL — ABNORMAL HIGH (ref 4.0–10.5)

## 2013-05-13 MED ORDER — DOXYCYCLINE HYCLATE 100 MG PO TABS
100.0000 mg | ORAL_TABLET | Freq: Two times a day (BID) | ORAL | Status: DC
  Administered 2013-05-13 – 2013-05-14 (×4): 100 mg via ORAL
  Filled 2013-05-13 (×6): qty 1

## 2013-05-13 NOTE — Progress Notes (Signed)
[facility] PROGRESS NOTE  Benjamin Fitzpatrick ZOX:[id number] DOB: 23-Sep-1986 DOA: 05/04/2013 PCP: No primary provider on file.  Assessment/Plan: FUO -Initially thought to have PNA. -Because of continued fevers despite abx therapy, CT scan chest was ordered that showed no evidence of PNA and lymph nodes that were likely reactive. -Because his only complaint is a sore throat, CT neck was done that was neg as well. -CT abd/pelvis negative. -HIV was negative. -Hep panel negative -All cx data has been negative to date. -ECHO pending. -ID following: abx has been changed to doxycycline.  Hyponatremia -Hypovolemic. -Resolved with IVF.  Hypokalemia -Repleted.  Systolic murmur -New since admission (?) -?related to fever. -ECHO pending.  Code Status: Full code Family Communication: Patient only Disposition Plan: Home when medically stable.   Consultants:  ID, Dr. Luciana Axe   Antibiotics:  Doxycycline  Subjective: Remains febrile. No complaints other than sore throat.  Objective: Filed Vitals:   05/13/13 0800 05/13/13 0939 05/13/13 1257 05/13/13 1335  BP:    128/75  Pulse:    107  Temp: 99.2 F (37.3 C) 98.9 F (37.2 C) 100.1 F (37.8 C) 102.5 F (39.2 C)  TempSrc: Oral Oral Oral Oral  Resp:    20  Height:      Weight:      SpO2:    96%    Intake/Output Summary (Last 24 hours) at 05/13/13 1422 Last data filed at 05/13/13 1400  Gross per 24 hour  Intake   2360 ml  Output      0 ml  Net   2360 ml   Filed Weights   05/05/13 0210  Weight: 63.8 kg (140 lb 10.5 oz)    Exam:   General:  AA Ox3  Cardiovascular: RRR, no M/R/G  Respiratory: CTA B, no thrush. Has marked parhyngeal erythema.  Abdomen: S/NT/ND/+BS/no masses or organomegaly noted.  Extremities: no C/C/E/+pedal pulses.   Neurologic:  Grossly intact and non-focal.  Data Reviewed: Basic Metabolic Panel:  Recent Labs Lab 05/08/13 0503 05/09/13 0515 05/10/13 0540 05/12/13 0505  NA 136 136  135 132*  K 3.2* 3.3* 3.6 3.4*  CL 100 99 100 97  CO2 25 27 26 24   GLUCOSE 127* 104* 109* 106*  BUN 5* 6 6 7   CREATININE 0.74 0.71 0.69 0.67  CALCIUM 8.1* 8.3* 8.2* 8.3*   Liver Function Tests:  Recent Labs Lab 05/12/13 0505  AST 38*  ALT 28  ALKPHOS 55  BILITOT 0.3  PROT 6.3  ALBUMIN 2.3*   No results found for this basename: LIPASE, AMYLASE,  in the last 168 hours No results found for this basename: AMMONIA,  in the last 168 hours CBC:  Recent Labs Lab 05/09/13 0515 05/10/13 0540 05/11/13 0543 05/12/13 0505 05/13/13 0435  WBC 12.9* 11.6* 13.5* 13.0* 12.5*  HGB 13.9 12.6* 13.2 12.2* 11.8*  HCT 39.3 35.9* 37.9* 35.2* 34.1*  MCV 83.3 83.5 84.2 83.8 83.8  PLT 210 220 290 326 342   Cardiac Enzymes: No results found for this basename: CKTOTAL, CKMB, CKMBINDEX, TROPONINI,  in the last 168 hours BNP (last 3 results) No results found for this basename: PROBNP,  in the last 8760 hours CBG: No results found for this basename: GLUCAP,  in the last 168 hours  Recent Results (from the past 240 hour(s))  URINE CULTURE     Status: None   Collection Time    05/04/13  9:40 PM      Result Value Range Status   Specimen Description URINE, RANDOM  Final   Special Requests NONE   Final   Culture  Setup Time 05/05/2013 11:37   Final   Colony Count 2,000 COLONIES/ML   Final   Culture INSIGNIFICANT GROWTH   Final   Report Status 05/06/2013 FINAL   Final  CULTURE, BLOOD (ROUTINE X 2)     Status: None   Collection Time    05/04/13 10:30 PM      Result Value Range Status   Specimen Description BLOOD LEFT ANTECUBITAL   Final   Special Requests BOTTLES DRAWN AEROBIC AND ANAEROBIC 5CC   Final   Culture  Setup Time 05/05/2013 10:42   Final   Culture NO GROWTH 5 DAYS   Final   Report Status 05/11/2013 FINAL   Final  CULTURE, BLOOD (ROUTINE X 2)     Status: None   Collection Time    05/04/13 11:00 PM      Result Value Range Status   Specimen Description BLOOD RIGHT ANTECUBITAL    Final   Special Requests BOTTLES DRAWN AEROBIC AND ANAEROBIC 5CC   Final   Culture  Setup Time 05/05/2013 10:42   Final   Culture NO GROWTH 5 DAYS   Final   Report Status 05/11/2013 FINAL   Final  CULTURE, BLOOD (ROUTINE X 2)     Status: None   Collection Time    05/11/13  8:35 AM      Result Value Range Status   Specimen Description BLOOD RIGHT HAND   Final   Special Requests BOTTLES DRAWN AEROBIC AND ANAEROBIC 5CC EACH   Final   Culture  Setup Time 05/11/2013 15:54   Final   Culture     Final   Value:        BLOOD CULTURE RECEIVED NO GROWTH TO DATE CULTURE WILL BE HELD FOR 5 DAYS BEFORE ISSUING A FINAL NEGATIVE REPORT   Report Status PENDING   Incomplete  CULTURE, BLOOD (ROUTINE X 2)     Status: None   Collection Time    05/11/13  8:48 AM      Result Value Range Status   Specimen Description BLOOD LEFT ARM   Final   Special Requests BOTTLES DRAWN AEROBIC AND ANAEROBIC 5CC EACH   Final   Culture  Setup Time 05/11/2013 15:54   Final   Culture     Final   Value:        BLOOD CULTURE RECEIVED NO GROWTH TO DATE CULTURE WILL BE HELD FOR 5 DAYS BEFORE ISSUING A FINAL NEGATIVE REPORT   Report Status PENDING   Incomplete  URINE CULTURE     Status: None   Collection Time    05/11/13 10:07 AM      Result Value Range Status   Specimen Description URINE, RANDOM   Final   Special Requests NONE   Final   Culture  Setup Time     Final   Value: 05/11/2013 17:09     Performed at [facility] Count     Final   Value: NO GROWTH     Performed at [facility]   Culture     Final   Value: NO GROWTH     Performed at [facility]   Report Status 05/12/2013 FINAL   Final     Studies: Ct Soft Tissue Neck W Contrast  05/12/2013   *RADIOLOGY REPORT*  Clinical Data: Fever unknown origin.  Sore throat  CT NECK WITH CONTRAST  Technique:  Multidetector  CT imaging of the neck was performed with intravenous contrast.  Contrast: OMNIPAQUE IOHEXOL 300 MG/ML  SOLN   Comparison: CT chest 05/09/2013  Findings: Lung apices are clear.  Infiltration of the anterior mediastinal fat compatible with mild adenopathy.  Largest node measures 11 mm with multiple smaller nodes present.  No acute bony abnormality in the cervical spine.  Normal alignment and no degenerative change.  The tongue and para pharyngeal soft tissues are normal.  Larynx and epiglottis are normal.  The thyroid is normal.  The submandibular and parotid glands are normal bilaterally.  No abscess or mass lesion.  No pathologic adenopathy.  There are scattered nonpathologically enlarged lymph nodes bilaterally.  The right level II lymph node measures 6.5 mm.  Sub centimeter posterior lymph nodes on the right including a supraclavicular node measuring 9 mm.  The left level II node measures 8.3 mm.  Sub centimeter posterior lymph nodes on the left.  IMPRESSION: Mild anterior mediastinal adenopathy.  No pathologic lymph nodes are present in the neck.  There are reactive type nodes present bilaterally.  Negative for abscess or mass in the neck.   Original Report Authenticated By: Janeece Riggers, M.D.    Scheduled Meds: . doxycycline  100 mg Oral Q12H  . feeding supplement  237 mL Oral BID BM   Continuous Infusions: . sodium chloride 100 mL/hr at 05/13/13 1354    Principal Problem:   Fever Active Problems:   Hyponatremia   Tachycardia   Rash   Hypokalemia   Systolic murmur    Time spent: 40 minutes.    Chaya Jan  [facility] Pager [phone]  If 7PM-7AM, please contact night-coverage at [email or web address], password [facility] 05/13/2013, 2:22 PM  LOS: 9 days

## 2013-05-13 NOTE — Progress Notes (Signed)
Pt wife request to speak with the Dr.  Text page sent to Dr Ardyth Harps and informed her of request.

## 2013-05-13 NOTE — Progress Notes (Addendum)
[facility]  Date of Admission:  05/04/2013  Antibiotics: Augmentin  Subjective: No new complaints Remains febrile, 102 now  Objective: Temp:  [98.2 F (36.8 C)-102.5 F (39.2 C)] 102.5 F (39.2 C) (08/05 1335) Pulse Rate:  [93-107] 107 (08/05 1335) Resp:  [20] 20 (08/05 1335) BP: (120-128)/(74-75) 128/75 mmHg (08/05 1335) SpO2:  [96 %] 96 % (08/05 1335)  General: Awake, alert, nad Skin: no rashes Lungs: CTA B Cor: RRR w SEM Abdomen: soft, nt, nd, +bs Ext: no edema  Lab Results Lab Results  Component Value Date   WBC 12.5* 05/13/2013   HGB 11.8* 05/13/2013   HCT 34.1* 05/13/2013   MCV 83.8 05/13/2013   PLT 342 05/13/2013    Lab Results  Component Value Date   CREATININE 0.67 05/12/2013   BUN 7 05/12/2013   NA 132* 05/12/2013   K 3.4* 05/12/2013   CL 97 05/12/2013   CO2 24 05/12/2013    Lab Results  Component Value Date   ALT 28 05/12/2013   AST 38* 05/12/2013   ALKPHOS 55 05/12/2013   BILITOT 0.3 05/12/2013      Microbiology: Recent Results (from the past 240 hour(s))  URINE CULTURE     Status: None   Collection Time    05/04/13  9:40 PM      Result Value Range Status   Specimen Description URINE, RANDOM   Final   Special Requests NONE   Final   Culture  Setup Time 05/05/2013 11:37   Final   Colony Count 2,000 COLONIES/ML   Final   Culture INSIGNIFICANT GROWTH   Final   Report Status 05/06/2013 FINAL   Final  CULTURE, BLOOD (ROUTINE X 2)     Status: None   Collection Time    05/04/13 10:30 PM      Result Value Range Status   Specimen Description BLOOD LEFT ANTECUBITAL   Final   Special Requests BOTTLES DRAWN AEROBIC AND ANAEROBIC 5CC   Final   Culture  Setup Time 05/05/2013 10:42   Final   Culture NO GROWTH 5 DAYS   Final   Report Status 05/11/2013 FINAL   Final  CULTURE, BLOOD (ROUTINE X 2)     Status: None   Collection Time    05/04/13 11:00 PM      Result Value Range Status   Specimen Description BLOOD RIGHT ANTECUBITAL   Final   Special Requests BOTTLES DRAWN AEROBIC AND ANAEROBIC 5CC   Final   Culture  Setup Time 05/05/2013 10:42   Final   Culture NO GROWTH 5 DAYS   Final   Report Status 05/11/2013 FINAL   Final  CULTURE, BLOOD (ROUTINE X 2)     Status: None   Collection Time    05/11/13  8:35 AM      Result Value Range Status   Specimen Description BLOOD RIGHT HAND   Final   Special Requests BOTTLES DRAWN AEROBIC AND ANAEROBIC 5CC EACH   Final   Culture  Setup Time 05/11/2013 15:54   Final   Culture     Final   Value:        BLOOD CULTURE RECEIVED NO GROWTH TO DATE CULTURE WILL BE HELD FOR 5 DAYS BEFORE ISSUING A FINAL NEGATIVE REPORT   Report Status PENDING   Incomplete  CULTURE, BLOOD (ROUTINE X 2)     Status: None   Collection Time    05/11/13  8:48 AM      Result  Value Range Status   Specimen Description BLOOD LEFT ARM   Final   Special Requests BOTTLES DRAWN AEROBIC AND ANAEROBIC 5CC EACH   Final   Culture  Setup Time 05/11/2013 15:54   Final   Culture     Final   Value:        BLOOD CULTURE RECEIVED NO GROWTH TO DATE CULTURE WILL BE HELD FOR 5 DAYS BEFORE ISSUING A FINAL NEGATIVE REPORT   Report Status PENDING   Incomplete  URINE CULTURE     Status: None   Collection Time    05/11/13 10:07 AM      Result Value Range Status   Specimen Description URINE, RANDOM   Final   Special Requests NONE   Final   Culture  Setup Time     Final   Value: 05/11/2013 17:09     Performed at [facility] Count     Final   Value: NO GROWTH     Performed at [facility]   Culture     Final   Value: NO GROWTH     Performed at [facility]   Report Status 05/12/2013 FINAL   Final    Studies/Results: Ct Soft Tissue Neck W Contrast  05/12/2013   *RADIOLOGY REPORT*  Clinical Data: Fever unknown origin.  Sore throat  CT NECK WITH CONTRAST  Technique:  Multidetector CT imaging of the neck was performed with intravenous contrast.  Contrast: OMNIPAQUE IOHEXOL 300 MG/ML  SOLN   Comparison: CT chest 05/09/2013  Findings: Lung apices are clear.  Infiltration of the anterior mediastinal fat compatible with mild adenopathy.  Largest node measures 11 mm with multiple smaller nodes present.  No acute bony abnormality in the cervical spine.  Normal alignment and no degenerative change.  The tongue and para pharyngeal soft tissues are normal.  Larynx and epiglottis are normal.  The thyroid is normal.  The submandibular and parotid glands are normal bilaterally.  No abscess or mass lesion.  No pathologic adenopathy.  There are scattered nonpathologically enlarged lymph nodes bilaterally.  The right level II lymph node measures 6.5 mm.  Sub centimeter posterior lymph nodes on the right including a supraclavicular node measuring 9 mm.  The left level II node measures 8.3 mm.  Sub centimeter posterior lymph nodes on the left.  IMPRESSION: Mild anterior mediastinal adenopathy.  No pathologic lymph nodes are present in the neck.  There are reactive type nodes present bilaterally.  Negative for abscess or mass in the neck.   Original Report Authenticated By: Janeece Riggers, M.D.    Assessment/Plan: 1) Fever without a source - no etiology identified.  Awaiting echo.  Neck CT unremarkable   -he does not know of childhood vaccines, whether or not he has had them, but no signs of vaccine preventable disease at this time.   -sore throat but no improvement with Augmentin so not c/w GAS.  No abscess.  HIV RNA negative.  Hepatitis panel negative.   -will await echo.  If unremarkable, this is truly an FUO.   -get blood cultures daily, -I will change antibiotic to doxy for ? Tick borne illness, though Ab negative.    Staci Righter, MD [facility] [email or web address] [phone] pager   [phone] cell 05/13/2013, 1:48 PM

## 2013-05-14 NOTE — Progress Notes (Signed)
[facility] PROGRESS NOTE  Benjamin Fitzpatrick ZOX:[id number] DOB: Jun 15, 1986 DOA: 05/04/2013 PCP: No primary provider on file.  Assessment/Plan: FUO -Initially thought to have PNA. -Because of continued fevers despite abx therapy, CT scan chest was ordered that showed no evidence of PNA and lymph nodes that were likely reactive. -Because his only complaint is a sore throat, CT neck was done that was neg as well. -CT abd/pelvis negative. -HIV was negative. -Hep panel negative -All cx data has been negative to date. -ECHO no vegetation.  -ID following: abx has been changed to doxycycline. -ANA negative, ACE negative. Legionella and strep antigen negative, ehrlichia antibody negative.  -CMV, EBV, pending.  -ESR 64,  C protein 17.  -report diarrhea since Monday, will send for c diff.   Hyponatremia -Hypovolemic. -Resolved with IVF. -repeat bmet in am.   Hypokalemia -Repleted.  Systolic murmur -New since admission (?) -?related to fever. -ECHO no significant valvular diseases, no vegetation.   Code Status: Full code Family Communication: Patient only Disposition Plan: to be determine.    Consultants:  ID, Dr. Luciana Axe   Antibiotics:  Doxycycline  Subjective: Remains febrile. No complaints.  He is tired of been in the hospital. Wife wants second opinion.   Objective: Filed Vitals:   05/14/13 0536 05/14/13 0555 05/14/13 1222 05/14/13 1353  BP: 122/68   99/64  Pulse:    102  Temp: 100.9 F (38.3 C) 102.2 F (39 C) 102.8 F (39.3 C) 99.7 F (37.6 C)  TempSrc: Oral Oral Oral Oral  Resp: 20   18  Height:      Weight:      SpO2: 96%   98%    Intake/Output Summary (Last 24 hours) at 05/14/13 1500 Last data filed at 05/14/13 1102  Gross per 24 hour  Intake 2103.34 ml  Output    300 ml  Net 1803.34 ml   Filed Weights   05/05/13 0210  Weight: 63.8 kg (140 lb 10.5 oz)    Exam:   General:  AA Ox3  Cardiovascular: RRR, no M/R/G  Respiratory: CTA B, no  thrush. Has marked parhyngeal erythema.  Abdomen: S/NT/ND/+BS/no masses or organomegaly noted.  Extremities: no C/C/E/+pedal pulses.   Neurologic:  Grossly intact and non-focal.  Data Reviewed: Basic Metabolic Panel:  Recent Labs Lab 05/08/13 0503 05/09/13 0515 05/10/13 0540 05/12/13 0505  NA 136 136 135 132*  K 3.2* 3.3* 3.6 3.4*  CL 100 99 100 97  CO2 25 27 26 24   GLUCOSE 127* 104* 109* 106*  BUN 5* 6 6 7   CREATININE 0.74 0.71 0.69 0.67  CALCIUM 8.1* 8.3* 8.2* 8.3*   Liver Function Tests:  Recent Labs Lab 05/12/13 0505  AST 38*  ALT 28  ALKPHOS 55  BILITOT 0.3  PROT 6.3  ALBUMIN 2.3*   No results found for this basename: LIPASE, AMYLASE,  in the last 168 hours No results found for this basename: AMMONIA,  in the last 168 hours CBC:  Recent Labs Lab 05/09/13 0515 05/10/13 0540 05/11/13 0543 05/12/13 0505 05/13/13 0435  WBC 12.9* 11.6* 13.5* 13.0* 12.5*  HGB 13.9 12.6* 13.2 12.2* 11.8*  HCT 39.3 35.9* 37.9* 35.2* 34.1*  MCV 83.3 83.5 84.2 83.8 83.8  PLT 210 220 290 326 342   Cardiac Enzymes: No results found for this basename: CKTOTAL, CKMB, CKMBINDEX, TROPONINI,  in the last 168 hours BNP (last 3 results) No results found for this basename: PROBNP,  in the last 8760 hours CBG: No results found for this  basename: GLUCAP,  in the last 168 hours  Recent Results (from the past 240 hour(s))  URINE CULTURE     Status: None   Collection Time    05/04/13  9:40 PM      Result Value Range Status   Specimen Description URINE, RANDOM   Final   Special Requests NONE   Final   Culture  Setup Time 05/05/2013 11:37   Final   Colony Count 2,000 COLONIES/ML   Final   Culture INSIGNIFICANT GROWTH   Final   Report Status 05/06/2013 FINAL   Final  CULTURE, BLOOD (ROUTINE X 2)     Status: None   Collection Time    05/04/13 10:30 PM      Result Value Range Status   Specimen Description BLOOD LEFT ANTECUBITAL   Final   Special Requests BOTTLES DRAWN AEROBIC AND  ANAEROBIC 5CC   Final   Culture  Setup Time 05/05/2013 10:42   Final   Culture NO GROWTH 5 DAYS   Final   Report Status 05/11/2013 FINAL   Final  CULTURE, BLOOD (ROUTINE X 2)     Status: None   Collection Time    05/04/13 11:00 PM      Result Value Range Status   Specimen Description BLOOD RIGHT ANTECUBITAL   Final   Special Requests BOTTLES DRAWN AEROBIC AND ANAEROBIC 5CC   Final   Culture  Setup Time 05/05/2013 10:42   Final   Culture NO GROWTH 5 DAYS   Final   Report Status 05/11/2013 FINAL   Final  CULTURE, BLOOD (ROUTINE X 2)     Status: None   Collection Time    05/11/13  8:35 AM      Result Value Range Status   Specimen Description BLOOD RIGHT HAND   Final   Special Requests BOTTLES DRAWN AEROBIC AND ANAEROBIC 5CC EACH   Final   Culture  Setup Time 05/11/2013 15:54   Final   Culture     Final   Value:        BLOOD CULTURE RECEIVED NO GROWTH TO DATE CULTURE WILL BE HELD FOR 5 DAYS BEFORE ISSUING A FINAL NEGATIVE REPORT   Report Status PENDING   Incomplete  CULTURE, BLOOD (ROUTINE X 2)     Status: None   Collection Time    05/11/13  8:48 AM      Result Value Range Status   Specimen Description BLOOD LEFT ARM   Final   Special Requests BOTTLES DRAWN AEROBIC AND ANAEROBIC 5CC EACH   Final   Culture  Setup Time 05/11/2013 15:54   Final   Culture     Final   Value:        BLOOD CULTURE RECEIVED NO GROWTH TO DATE CULTURE WILL BE HELD FOR 5 DAYS BEFORE ISSUING A FINAL NEGATIVE REPORT   Report Status PENDING   Incomplete  URINE CULTURE     Status: None   Collection Time    05/11/13 10:07 AM      Result Value Range Status   Specimen Description URINE, RANDOM   Final   Special Requests NONE   Final   Culture  Setup Time     Final   Value: 05/11/2013 17:09     Performed at [facility] Count     Final   Value: NO GROWTH     Performed at [facility]   Culture     Final   Value: NO GROWTH  Performed at [facility]   Report Status  05/12/2013 FINAL   Final     Studies: Ct Soft Tissue Neck W Contrast  05/12/2013   *RADIOLOGY REPORT*  Clinical Data: Fever unknown origin.  Sore throat  CT NECK WITH CONTRAST  Technique:  Multidetector CT imaging of the neck was performed with intravenous contrast.  Contrast: OMNIPAQUE IOHEXOL 300 MG/ML  SOLN  Comparison: CT chest 05/09/2013  Findings: Lung apices are clear.  Infiltration of the anterior mediastinal fat compatible with mild adenopathy.  Largest node measures 11 mm with multiple smaller nodes present.  No acute bony abnormality in the cervical spine.  Normal alignment and no degenerative change.  The tongue and para pharyngeal soft tissues are normal.  Larynx and epiglottis are normal.  The thyroid is normal.  The submandibular and parotid glands are normal bilaterally.  No abscess or mass lesion.  No pathologic adenopathy.  There are scattered nonpathologically enlarged lymph nodes bilaterally.  The right level II lymph node measures 6.5 mm.  Sub centimeter posterior lymph nodes on the right including a supraclavicular node measuring 9 mm.  The left level II node measures 8.3 mm.  Sub centimeter posterior lymph nodes on the left.  IMPRESSION: Mild anterior mediastinal adenopathy.  No pathologic lymph nodes are present in the neck.  There are reactive type nodes present bilaterally.  Negative for abscess or mass in the neck.   Original Report Authenticated By: Janeece Riggers, M.D.    Scheduled Meds: . doxycycline  100 mg Oral Q12H  . feeding supplement  237 mL Oral BID BM   Continuous Infusions: . sodium chloride 100 mL/hr at 05/14/13 1102    Principal Problem:   Fever Active Problems:   Hyponatremia   Tachycardia   Rash   Hypokalemia   Systolic murmur    Time spent: 35 minutes.    Bacilio Abascal  [facility] Pager [phone]  If 7PM-7AM, please contact night-coverage at [email or web address], password [facility] 05/14/2013, 3:00 PM  LOS: 10 days

## 2013-05-14 NOTE — Progress Notes (Signed)
INITIAL NUTRITION ASSESSMENT  DOCUMENTATION CODES Per approved criteria  -Not Applicable   INTERVENTION: - Ensure Complete BID - Encouraged increased PO intake - Will continue to monitor  NUTRITION DIAGNOSIS: Inadequate oral intake related to poor appetite from fever as evidenced by pt report.   Goal: Pt to consume >90% of meals/supplements  Monitor:  Weights, labs, intake  Reason for Assessment: Poor intake  27 y.o. male  Admitting Dx: Fever  ASSESSMENT: Pt admitted with poor appetite x 8 days. Met with pt today who reports typically eating 2 meals/day at home but had poor appetite from the fever. States his wife brings in food for him and he has been eating a few meals/day, had some ramen noodles for breakfast this morning. States his sore throat is better. States he used to weigh 165 pounds but is unsure how long ago this was. Interested in getting nutritional supplements. Noted pt's potassium slightly down with elevated AST.   Height: Ht Readings from Last 1 Encounters:  05/05/13 5\' 6"  (1.676 m)    Weight: Wt Readings from Last 1 Encounters:  05/05/13 140 lb 10.5 oz (63.8 kg)    Ideal Body Weight: 142 lb  % Ideal Body Weight: 98%  Wt Readings from Last 10 Encounters:  05/05/13 140 lb 10.5 oz (63.8 kg)    Usual Body Weight: 165 lb  % Usual Body Weight: 85%  BMI:  Body mass index is 22.71 kg/(m^2).  Estimated Nutritional Needs: Kcal: 1600-1900 Protein: 65-75g Fluid: 1.6-1.9L/day  Skin: Intact   Diet Order: General  EDUCATION NEEDS: -No education needs identified at this time   Intake/Output Summary (Last 24 hours) at 05/14/13 1255 Last data filed at 05/14/13 1102  Gross per 24 hour  Intake 3023.34 ml  Output    300 ml  Net 2723.34 ml    Last BM: 7/28  Labs:   Recent Labs Lab 05/09/13 0515 05/10/13 0540 05/12/13 0505  NA 136 135 132*  K 3.3* 3.6 3.4*  CL 99 100 97  CO2 27 26 24   BUN 6 6 7   CREATININE 0.71 0.69 0.67  CALCIUM  8.3* 8.2* 8.3*  GLUCOSE 104* 109* 106*    CBG (last 3)  No results found for this basename: GLUCAP,  in the last 72 hours  Scheduled Meds: . doxycycline  100 mg Oral Q12H  . feeding supplement  237 mL Oral BID BM    Continuous Infusions: . sodium chloride 100 mL/hr at 05/14/13 1102    History reviewed. No pertinent past medical history.  History reviewed. No pertinent past surgical history.   Levon Hedger MS, RD, LDN [phone] Pager [phone] After Hours Pager

## 2013-05-15 ENCOUNTER — Inpatient Hospital Stay (HOSPITAL_COMMUNITY): Payer: Medicaid Other

## 2013-05-15 DIAGNOSIS — R590 Localized enlarged lymph nodes: Secondary | ICD-10-CM | POA: Diagnosis present

## 2013-05-15 DIAGNOSIS — R599 Enlarged lymph nodes, unspecified: Secondary | ICD-10-CM

## 2013-05-15 LAB — EBV AB TO VIRAL CAPSID AG PNL, IGG+IGM
EBV VCA IgG: >750 U/mL — ABNORMAL HIGH (ref <18.0)
EBV VCA IgM: <10 U/mL (ref <36.0)

## 2013-05-15 LAB — CBC
MCHC: 34.7 g/dL (ref 30.0–36.0)
RDW: 12.7 % (ref 11.5–15.5)

## 2013-05-15 LAB — CMV IGM: CMV IgM: <8 AU/mL (ref <30.00)

## 2013-05-15 LAB — BASIC METABOLIC PANEL
GFR calc Af Amer: >90 mL/min (ref >90)
GFR calc non Af Amer: >90 mL/min (ref >90)
Potassium: 3.7 mEq/L (ref 3.5–5.1)
Sodium: 132 mEq/L — ABNORMAL LOW (ref 135–145)

## 2013-05-15 MED ORDER — FENTANYL CITRATE 0.05 MG/ML IJ SOLN
INTRAMUSCULAR | Status: AC | PRN
  Administered 2013-05-15 (×2): 100 ug via INTRAVENOUS

## 2013-05-15 NOTE — Progress Notes (Signed)
Pt's sister in law states that the pt is expressing concerns about being tested for leukemia due to his mother passing away from leukemia. Pt's sister in law also states the patient complains of pain in the knees when his fever increases, but denies any pain at this time. Explained to the pt and family members that the MD in the morning would be notified of concerns.

## 2013-05-15 NOTE — Progress Notes (Addendum)
[facility]  Date of Admission:  05/04/2013  Antibiotics: doxycycline  Subjective: No new complaints In bathroom  Objective: Temp:  [99.3 F (37.4 C)-103 F (39.4 C)] 99.4 F (37.4 C) (08/07 0824) Pulse Rate:  [91-102] 91 (08/06 2103) Resp:  [18] 18 (08/06 2103) BP: (99-109)/(64-70) 109/70 mmHg (08/06 2103) SpO2:  [96 %-98 %] 96 % (08/06 2103)  General: Awake, alert, nad Patient in bathroom  Lab Results Lab Results  Component Value Date   WBC 12.6* 05/15/2013   HGB 12.0* 05/15/2013   HCT 34.6* 05/15/2013   MCV 84.4 05/15/2013   PLT 328 05/15/2013    Lab Results  Component Value Date   CREATININE 0.76 05/15/2013   BUN 9 05/15/2013   NA 132* 05/15/2013   K 3.7 05/15/2013   CL 100 05/15/2013   CO2 24 05/15/2013    Lab Results  Component Value Date   ALT 28 05/12/2013   AST 38* 05/12/2013   ALKPHOS 55 05/12/2013   BILITOT 0.3 05/12/2013      Microbiology: Recent Results (from the past 240 hour(s))  CULTURE, BLOOD (ROUTINE X 2)     Status: None   Collection Time    05/11/13  8:35 AM      Result Value Range Status   Specimen Description BLOOD RIGHT HAND   Final   Special Requests BOTTLES DRAWN AEROBIC AND ANAEROBIC 5CC EACH   Final   Culture  Setup Time 05/11/2013 15:54   Final   Culture     Final   Value:        BLOOD CULTURE RECEIVED NO GROWTH TO DATE CULTURE WILL BE HELD FOR 5 DAYS BEFORE ISSUING A FINAL NEGATIVE REPORT   Report Status PENDING   Incomplete  CULTURE, BLOOD (ROUTINE X 2)     Status: None   Collection Time    05/11/13  8:48 AM      Result Value Range Status   Specimen Description BLOOD LEFT ARM   Final   Special Requests BOTTLES DRAWN AEROBIC AND ANAEROBIC 5CC EACH   Final   Culture  Setup Time 05/11/2013 15:54   Final   Culture     Final   Value:        BLOOD CULTURE RECEIVED NO GROWTH TO DATE CULTURE WILL BE HELD FOR 5 DAYS BEFORE ISSUING A FINAL NEGATIVE REPORT   Report Status PENDING   Incomplete  URINE CULTURE     Status: None   Collection Time    05/11/13 10:07 AM      Result Value Range Status   Specimen Description URINE, RANDOM   Final   Special Requests NONE   Final   Culture  Setup Time     Final   Value: 05/11/2013 17:09     Performed at [facility] Count     Final   Value: NO GROWTH     Performed at [facility]   Culture     Final   Value: NO GROWTH     Performed at [facility]   Report Status 05/12/2013 FINAL   Final    Studies/Results: No results found.  Assessment/Plan: 1) Fever without a source - no etiology identified.  Negative echo.  Neck CT unremarkable   -CMV, EBV negative for acute infection.  -next step would be bone marrow biopsy.   -bone marrow for ? Leukemia, culture for AFB and fungal stain, AFB and fungal culture  -  will d/c doxy   Laini Urick, Molly Maduro, MD [facility] [email or web address] [phone] pager   [phone] cell 05/15/2013, 8:29 AM

## 2013-05-15 NOTE — Procedures (Signed)
Technically successful CT guided bone marrow aspiration and biopsy of left iliac crest. An additional BM sample was obtained for culture. No immediate complications. Awaiting pathology report.

## 2013-05-15 NOTE — Progress Notes (Addendum)
[facility] PROGRESS NOTE  Benjamin Fitzpatrick ZOX:[id number] DOB: [birth year] DOA: 05/04/2013 PCP: No primary provider on file.  Assessment/Plan: FUO -Initially thought to have PNA. -Because of continued fevers despite abx therapy, CT scan chest was ordered that showed no evidence of PNA and mediastinal lymphadenopathy.  -Because his only complaint is a sore throat, CT neck was done that was neg as well. -CT abd/pelvis negative. -HIV was negative. -Hep panel negative -All cx data has been negative to date. -ECHO no vegetation.  -ID following: abx has been stopped.  -ANA negative, ACE negative. Legionella and strep antigen negative, ehrlichia antibody negative.  -CMV IgM negative, EBV IgM negative,  -ESR 64,  C protein 17.  -no more diarrhea, unable to collect C diff.  -Patient and family wanted to be transfer to [facility]. I spoke with Dr Brendolyn Patty from [place], he agree with current work up, he didn't accept the patient in transfer, he recommend pulmonary evaluation of mediastinal lymphadenopathy.   For BM biopsy today.  Addendum: I ask pulmonary to see for mediastinal lymphadenopathy Biopsy.   Hyponatremia -Hypovolemic. -Improved  with IVF. -repeat bmet in am.   Hypokalemia -Repleted.  Systolic murmur -New since admission (?) -?related to fever. -ECHO no significant valvular diseases, no vegetation.   Code Status: Full code Family Communication: Patient and wife.  Disposition Plan: to be determine.    Consultants:  ID, Dr. Luciana Axe   Antibiotics:  Doxycycline stopped 8-7  Subjective: Remains febrile. No complaints.  No new complaint, no more diarrhea.  Patient and family wanted to be transfer to [facility].   Objective: Filed Vitals:   05/14/13 1841 05/14/13 2031 05/14/13 2103 05/15/13 0824  BP:   109/70   Pulse:   91   Temp: 103 F (39.4 C) 101.8 F (38.8 C) 99.3 F (37.4 C) 99.4 F (37.4 C)  TempSrc: Oral Oral Oral Oral  Resp:   18   Height:      Weight:       SpO2:   96%     Intake/Output Summary (Last 24 hours) at 05/15/13 1041 Last data filed at 05/14/13 2200  Gross per 24 hour  Intake 1548.34 ml  Output    300 ml  Net 1248.34 ml   Filed Weights   05/05/13 0210  Weight: 63.8 kg (140 lb 10.5 oz)    Exam:   General:  AA Ox3  Cardiovascular: RRR, no M/R/G  Respiratory: CTA B, no thrush. Has marked parhyngeal erythema.  Abdomen: S/NT/ND/+BS/no masses or organomegaly noted.  Extremities: no C/C/E/+pedal pulses.   Neurologic:  Grossly intact and non-focal.  Data Reviewed: Basic Metabolic Panel:  Recent Labs Lab 05/09/13 0515 05/10/13 0540 05/12/13 0505 05/15/13 0450  NA 136 135 132* 132*  K 3.3* 3.6 3.4* 3.7  CL 99 100 97 100  CO2 27 26 24 24   GLUCOSE 104* 109* 106* 96  BUN 6 6 7 9   CREATININE 0.71 0.69 0.67 0.76  CALCIUM 8.3* 8.2* 8.3* 8.3*   Liver Function Tests:  Recent Labs Lab 05/12/13 0505  AST 38*  ALT 28  ALKPHOS 55  BILITOT 0.3  PROT 6.3  ALBUMIN 2.3*   No results found for this basename: LIPASE, AMYLASE,  in the last 168 hours No results found for this basename: AMMONIA,  in the last 168 hours CBC:  Recent Labs Lab 05/10/13 0540 05/11/13 0543 05/12/13 0505 05/13/13 0435 05/15/13 0450  WBC 11.6* 13.5* 13.0* 12.5* 12.6*  HGB 12.6* 13.2 12.2* 11.8* 12.0*  HCT 35.9*  37.9* 35.2* 34.1* 34.6*  MCV 83.5 84.2 83.8 83.8 84.4  PLT 220 290 326 342 328   Cardiac Enzymes: No results found for this basename: CKTOTAL, CKMB, CKMBINDEX, TROPONINI,  in the last 168 hours BNP (last 3 results) No results found for this basename: PROBNP,  in the last 8760 hours CBG: No results found for this basename: GLUCAP,  in the last 168 hours  Recent Results (from the past 240 hour(s))  CULTURE, BLOOD (ROUTINE X 2)     Status: None   Collection Time    05/11/13  8:35 AM      Result Value Range Status   Specimen Description BLOOD RIGHT HAND   Final   Special Requests BOTTLES DRAWN AEROBIC AND ANAEROBIC  5CC EACH   Final   Culture  Setup Time 05/11/2013 15:54   Final   Culture     Final   Value:        BLOOD CULTURE RECEIVED NO GROWTH TO DATE CULTURE WILL BE HELD FOR 5 DAYS BEFORE ISSUING A FINAL NEGATIVE REPORT   Report Status PENDING   Incomplete  CULTURE, BLOOD (ROUTINE X 2)     Status: None   Collection Time    05/11/13  8:48 AM      Result Value Range Status   Specimen Description BLOOD LEFT ARM   Final   Special Requests BOTTLES DRAWN AEROBIC AND ANAEROBIC 5CC EACH   Final   Culture  Setup Time 05/11/2013 15:54   Final   Culture     Final   Value:        BLOOD CULTURE RECEIVED NO GROWTH TO DATE CULTURE WILL BE HELD FOR 5 DAYS BEFORE ISSUING A FINAL NEGATIVE REPORT   Report Status PENDING   Incomplete  URINE CULTURE     Status: None   Collection Time    05/11/13 10:07 AM      Result Value Range Status   Specimen Description URINE, RANDOM   Final   Special Requests NONE   Final   Culture  Setup Time     Final   Value: 05/11/2013 17:09     Performed at [facility] Count     Final   Value: NO GROWTH     Performed at [facility]   Culture     Final   Value: NO GROWTH     Performed at [facility]   Report Status 05/12/2013 FINAL   Final     Studies: No results found.  Scheduled Meds: . feeding supplement  237 mL Oral BID BM   Continuous Infusions: . sodium chloride 100 mL/hr at 05/15/13 1191    Principal Problem:   Fever Active Problems:   Hyponatremia   Tachycardia   Rash   Hypokalemia   Systolic murmur    Time spent: 35 minutes.    REGALADO,BELKYS  [facility] Pager [phone]  If 7PM-7AM, please contact night-coverage at [email or web address], password [facility] 05/15/2013, 10:41 AM  LOS: 11 days

## 2013-05-15 NOTE — Progress Notes (Signed)
Received a phone call from a family member(Letteria) asking if we could get another Dr for the pt.  Explained that we can not discuss the pt due to [place] laws.  Pt was asked if he wanted a new Dr and the pt wife states yes.  Dr. Sunnie Nielsen called and informed.  Lysle Pearl also also wanted me to add her to pt contacts.  Pt states ok to add her.

## 2013-05-15 NOTE — Consult Note (Signed)
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 His hospital course has been marked by daily fevers generally low-grade but as high as 102.5. He also had some substernal chest pain which appears to have resolved. Due to complaints of sore throat, CT neck was done which did not show tonsillar abscess. CT chest showed residual thymic tissue but mild congestive lymphadenopathy was noted with the largest measuring 2x1.8 cm. Trace bilateral effusions and pelvic ascites was noted on CT abdomen. Of note there was no suspicious abdominal lymphadenopathy or splenomegaly.  Extensive workup so far has included: HIV  negative.  -Hep panel negative  -All cx data negative to date.  -ECHO no vegetation.  -ANA negative, ACE negative. Legionella and strep antigen negative, ehrlichia antibody negative.  -CMV IgM negative, EBV IgM negative,  -ESR 64, C protein 17.  -LDH was high at 510. He underwent bone marrow biopsy today. We're consulted to comment on need for mediastinal lymph node biopsy   PAST MEDICAL HISTORY :  History reviewed. No pertinent past medical history. History reviewed. No pertinent past surgical history. Prior to Admission medications   Medication Sig Start Date End Date Taking? Authorizing Provider  acetaminophen (TYLENOL) 500 MG tablet Take 500 mg by mouth every 6 (six) hours as needed for pain.   Yes Historical Provider, MD  ibuprofen (ADVIL,MOTRIN) 200 MG tablet Take 600 mg by mouth every 6 (six) hours as needed for pain.   Yes Historical Provider, MD   No Known Allergies  FAMILY HISTORY:  History reviewed. No pertinent family history. SOCIAL HISTORY:  reports that he has quit smoking. He does not have any smokeless tobacco history on file. He reports that he does not drink alcohol. His drug history is not on file.  REVIEW OF SYSTEMS:   Constitutional: Negative for chills, weight loss, malaise/fatigue and diaphoresis.  HENT: Negative for hearing loss, ear pain, nosebleeds, congestion, sore throat, neck pain, tinnitus and ear discharge.   Eyes: Negative for blurred vision, double vision, photophobia, pain, discharge and redness.  Respiratory: Negative for cough, hemoptysis, sputum production, shortness of breath, wheezing and stridor.   Cardiovascular: Negative for chest pain, palpitations, orthopnea, claudication, leg swelling and PND.  Gastrointestinal: Negative for heartburn, nausea, vomiting, abdominal pain, diarrhea, constipation, blood in stool and melena.  Genitourinary: Negative for dysuria, urgency, frequency, hematuria and flank pain.  Musculoskeletal: Negative for myalgias, back pain, joint pain and falls.  Skin: Negative for itching and rash.  Neurological: Negative for dizziness, tingling, tremors, sensory change, speech change, focal weakness, seizures, loss of consciousness, weakness and headaches.  Endo/Heme/Allergies: Negative for environmental allergies and polydipsia. Does not bruise/bleed easily.  SUBJECTIVE:   VITAL SIGNS: Temp:  [98.7 F (37.1 C)-103 F (39.4 C)] 100.1 F (37.8 C) (08/07 1746) Pulse Rate:  [91-105] 105 (08/07 1746) Resp:  [18-34] 20 (08/07 1746) BP: (97-124)/(56-86) 97/58 mmHg (08/07 1746) SpO2:  [95 %-99 %] 95 % (08/07 1746)  PHYSICAL EXAMINATION: Gen. Pleasant, well-nourished, in no distress, normal affect ENT - no lesions, no post nasal drip Neck: No JVD, no thyromegaly, no carotid bruits Lungs: no use of accessory muscles, no dullness to percussion, clear without rales or rhonchi  Cardiovascular: Rhythm  regular, heart sounds  normal, no murmurs, no peripheral edema Abdomen: soft and  non-tender, no hepatosplenomegaly, BS normal. Musculoskeletal: No deformities, no cyanosis or clubbing Neuro:  alert, non focal Skin:  Warm, no lesions/ rash    Recent Labs Lab 05/10/13 0540 05/12/13 0505 05/15/13 0450  NA 135 132* 132*  K 3.6 3.4* 3.7  CL 100 97 100  CO2 26 24 24   BUN 6 7 9   CREATININE 0.69 0.67 0.76  GLUCOSE 109* 106* 96    Recent Labs Lab 05/12/13 0505 05/13/13 0435 05/15/13 0450  HGB 12.2* 11.8* 12.0*  HCT 35.2* 34.1* 34.6*  WBC 13.0* 12.5* 12.6*  PLT 326 342 328       ASSESSMENT / PLAN:  Fever of unknown origin - due to elevated LDH there is some concern for lymphoma. However absence of abdominal lymphadenopathy or splenomegaly is reassuring. He has undergone bone marrow biopsy and I would wait on those results . I would ensure that bone marrow specimen was also sent for culture. If this is nondiagnostic, we can consider mediastinal lymph node biopsy by CT surgery or perhaps IR ,however I am not impressed by the degree of enlargement of the lymph nodes on CT scan. The anterior mediastinum is not approachable by endobronchial ultrasound.  Please call back as needed  [facility]   Pulmonary and Critical Care Medicine  HealthCare Pager: [phone]  05/15/2013, 5:50 PM

## 2013-05-16 MED ORDER — PROMETHAZINE HCL 12.5 MG PO TABS: 25.0000 mg | ORAL_TABLET | Freq: Four times a day (QID) | ORAL | Status: DC | PRN

## 2013-05-16 NOTE — Progress Notes (Signed)
Discharge instructions given to pt/family, verbalized understanding. Left the unit in stable condition. 

## 2013-05-16 NOTE — Discharge Summary (Signed)
Physician Discharge Summary  Cheney Ewart XBJ:[id number] DOB: 04-Oct-1986 DOA: 05/04/2013  PCP: No primary provider on file.  Admit date: 05/04/2013 Discharge date: 05/16/2013  Recommendations for Outpatient Follow-up:  1. Pt will need to follow up with PCP in 2-3 weeks post discharge 2. Please obtain BMP to evaluate electrolytes and kidney function 3. Please also check CBC to evaluate Hg and Hct levels 4. 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 His hospital course has been marked by daily fevers generally low-grade but as high as 102.5. He also had some substernal chest pain which appears to have resolved. Due to complaints of sore throat, CT neck was done which did not show tonsillar abscess. CT chest showed residual thymic tissue but mild congestive lymphadenopathy was noted with the largest measuring 2x1.8 cm. Trace bilateral effusions and pelvic ascites was noted on CT abdomen. Of note there was no suspicious abdominal lymphadenopathy or splenomegaly.   Hospital Course:  Principal Problem:   Fever Extensive workup so far has included:  HIV negative.  -Hep panel negative  -All cx data negative to date.  -ECHO no vegetation.  -ANA negative, ACE negative. Legionella and strep antigen negative, ehrlichia antibody negative.  -CMV IgM negative, EBV IgM negative,  -ESR 64, C protein 17.  -LDH was high at 510.  -He underwent bone marrow biopsy and the results need to be followed and pt made aware Active Problems:    Hyponatremia - responded to IVF and resolved   Tachycardia - secondary to fever, resolved   Rash - possibly viral component given hyponatremia and fever - improving    Hypokalemia - supplemented and within normal limits    Systolic murmur  Procedures/Studies: Dg Chest 2 View  05/05/2013   *RADIOLOGY REPORT*  Clinical Data: Chest pain  CHEST - 2 VIEW  Comparison: May 04, 2013  Findings: There is mild patchy opacity in the medial left lung base.  There is no pleural effusion bilaterally.  There is no pulmonary edema.  The mediastinal contour and cardiac silhouette are normal.  The soft tissues and osseous structures are stable.  IMPRESSION: Probable early pneumonia of left lung base.   Original Report Authenticated By: Sherian Rein, M.D.   Dg Chest 2 View  05/04/2013   *RADIOLOGY REPORT*  Clinical Data: Fever, body aches, former smoker  CHEST - 2 VIEW  Comparison: None.  Findings: Normal cardiac silhouette and mediastinal contours. There is minimal bilateral infrahilar opacities which are favored to represent atelectasis.  Possible developing heterogeneous air space opacity within the left lower lung.  No focal airspace opacities.  No pleural effusion or pneumothorax.  No definite evidence of edema.  Grossly unchanged bones.  IMPRESSION:  Possible developing air space opacity within the left lower lung worrisome for infection.   Original Report Authenticated By: Tacey Ruiz, MD   Ct Soft Tissue Neck W Contrast  05/12/2013   *RADIOLOGY REPORT*  Clinical Data: Fever unknown origin.  Sore throat  CT NECK WITH CONTRAST  Technique:  Multidetector CT imaging of the  neck was performed with intravenous contrast.  Contrast: OMNIPAQUE IOHEXOL 300 MG/ML  SOLN  Comparison: CT chest 05/09/2013  Findings: Lung apices are clear.  Infiltration of the anterior mediastinal fat compatible with mild adenopathy.  Largest node measures 11 mm with multiple smaller nodes present.  No acute bony abnormality in the  cervical spine.  Normal alignment and no degenerative change.  The tongue and para pharyngeal soft tissues are normal.  Larynx and epiglottis are normal.  The thyroid is normal.  The submandibular and parotid glands are normal bilaterally.  No abscess or mass lesion.  No pathologic adenopathy.  There are scattered nonpathologically enlarged lymph nodes bilaterally.  The right level II lymph node measures 6.5 mm.  Sub centimeter posterior lymph nodes on the right including a supraclavicular node measuring 9 mm.  The left level II node measures 8.3 mm.  Sub centimeter posterior lymph nodes on the left.  IMPRESSION: Mild anterior mediastinal adenopathy.  No pathologic lymph nodes are present in the neck.  There are reactive type nodes present bilaterally.  Negative for abscess or mass in the neck.   Original Report Authenticated By: Janeece Riggers, M.D.   Ct Chest W Contrast  05/09/2013   *RADIOLOGY REPORT*  Clinical Data fever, chills, evaluate for pneumonia.  CT CHEST WITH CONTRAST  Technique:  Multidetector CT imaging of the chest was performed following the standard protocol during bolus administration of intravenous contrast.  Contrast: 80mL OMNIPAQUE IOHEXOL 300 MG/ML  SOLN  Comparison: Chest x-ray May 07, 2013  Findings: There is a minimal left pleural effusion.  There are mild atelectasis of the medial bilateral lung bases.  The atelectasis in the medial left lung base correlates to the CT finding.  There is no focal pulmonary nodule or mass.  There is no pulmonary edema. The mediastinal windows demonstrate abnormal soft tissue in the anterior mediastinum at least in part due to enlarged lymph nodes largest measures 2 x 1.8 cm.  Residual planus tissue may account for some of the soft tissues in the anterior mediastinum.  There is no hilar lymphadenopathy.  The heart size is normal.  The visualized upper abdominal structures are normal.  The bones are normal.  IMPRESSION: Atelectasis of the medial lung bases  correlating to the chest x-ray finding.  Minimal left pleural effusion.  There is no focal pneumonia.  Abnormal soft tissue in the anterior mediastinum at least in part due to residual thymus but there are abnormal enlarged mediastinal lymph nodes identified. The enlarged mediastinal lymph nodes are probably  reactive lymph nodes but neoplastic etiology such as lymphoma is not excluded.   Original Report Authenticated By: Sherian Rein, M.D.   Ct Abdomen Pelvis W Contrast  05/11/2013   *RADIOLOGY REPORT*  Clinical Data: Fever, enlarged mediastinal lymph nodes on chest CT  CT ABDOMEN AND PELVIS WITH CONTRAST  Technique:  Multidetector CT imaging of the abdomen and pelvis was performed following the standard protocol during bolus administration of intravenous contrast.  Contrast: 100 ml Omnipaque-300 IV  Comparison: None.  Findings: Motion degraded images.  Trace bilateral pleural effusions, left greater than right, with associated mild dependent atelectasis.  Liver, spleen, pancreas, and adrenal glands are within normal limits.  Gallbladder is underdistended.  No intrahepatic or extrahepatic ductal dilatation.  Kidneys are within normal limits.  No hydronephrosis.  No evidence of bowel obstruction.  Normal appendix.  Left colon is underdistended.  No evidence of abdominal aortic aneurysm.  Trace pelvic ascites (series 2/image 77).  No suspicious abdominopelvic lymphadenopathy.  Prostate is unremarkable.  Bladder is within normal limits.  Visualized osseous structures are within normal limits.  IMPRESSION: Motion degraded images.  No suspicious abdominopelvic lymphadenopathy.  Trace pelvic ascites.  Trace bilateral pleural effusions, left greater than right, with associated dependent atelectasis.   Original Report Authenticated By: Charline Bills, M.D.   Ct Biopsy  05/15/2013   *RADIOLOGY REPORT*  Indication: Fever of unknown origin, evaluate for leukemia; please obtain an additional sample for infectious panel   CT GUIDED LEFT ILIAC BONE MARROW ASPIRATION AND BONE MARROW CORE BIOPSIES  Intravenous medications: Fentanyl 200 mcg IV  Sedation time: 10 minutes  Contrast volume: None  Complications: None immediate  PROCEDURE/FINDINGS:  Informed consent was obtained from the patient following an explanation of the procedure, risks, benefits and alternatives. The patient understands, agrees and consents for the procedure. All questions were addressed.  A time out was performed prior to the initiation of the procedure.  The patient was positioned prone and noncontrast localization CT was performed of the pelvis to demonstrate the iliac marrow spaces.  The operative site was prepped and draped in the usual sterile fashion.  Under sterile conditions and local anesthesia, an 11 gauge coaxial bone biopsy needle was advanced into the left iliac marrow space. Needle position was confirmed with CT imaging.  Initially, bone marrow aspiration was performed. Next, a bone marrow biopsy was obtained with the 11 gauge outer bone marrow device. The 11 gauge coaxial bone biopsy needle was readvanced into a slightly different location within the left iliac marrow space, positioning was confirmed and an additional bone marrow biopsy was obtained. Samples were prepared with the cytotechnologist and deemed adequate. The second required sample was set aside for infection panel.  The needle was removed intact.  Hemostasis was obtained with compression and a dressing was placed. The patient tolerated the procedure well without immediate post procedural complication.  IMPRESSION:  1.  Successful CT guided left iliac bone marrow aspiration and core biopsies.  2.  An additional bone marrow biopsy was set obtained for infectious panel as was ordered by the primary team.   Original Report Authenticated By: Tacey Ruiz, MD   Dg Chest [facility] 1 View  05/07/2013   *RADIOLOGY REPORT*  Clinical Data: Possible pneumo  PORTABLE CHEST - 1 VIEW  Comparison:  05/05/2013  Findings: Heart, mediastinal, and hilar contours are normal.  The pulmonary vascularity is normal.  There is a small focal opacity at the left lung base.  This appears new compared to recent chest radiograph.  The right lung screw clear.  The trachea is midline. The bones are unremarkable.  Visualized bowel gas pattern is normal.  IMPRESSION: Focal opacity at the medial left lung base.  This could reflect airspace disease or atelectasis.  Suggest follow-up to clearing.   Original Report Authenticated By: Britta Mccreedy, M.D.    Consultations:  PCCM  ID  Antibiotics:  None  Discharge Exam: Filed Vitals:   05/16/13 0937  BP:   Pulse:   Temp: 97.6 F (36.4 C)  Resp:    Filed Vitals:   05/16/13 0600 05/16/13 0740 05/16/13 0859 05/16/13 0937  BP: 108/65     Pulse: 87     Temp: 99.2 F (37.3 C) 97.5 F (36.4 C) 98.7 F (37.1 C) 97.6 F (36.4 C)  TempSrc: Oral Oral Oral Oral  Resp: 20     Height:      Weight:      SpO2: 94%  General: Pt is alert, follows commands appropriately, not in acute distress Cardiovascular: Regular rate and rhythm, S1/S2 +, SEM 2/6, no rubs, no gallops Respiratory: Clear to auscultation bilaterally, no wheezing, no crackles, no rhonchi Abdominal: Soft, non tender, non distended, bowel sounds +, no guarding Extremities: no edema, no cyanosis, pulses palpable bilaterally DP and PT Neuro: Grossly nonfocal  Discharge Instructions  Discharge Orders   Future Appointments Provider Department Dept Phone   05/20/2013 11:30 AM Gardiner Barefoot, MD [facility] [phone]   Future Orders Complete By Expires     Diet - low sodium heart healthy  As directed     Increase activity slowly  As directed         Medication List    STOP taking these medications       ibuprofen 200 MG tablet  Commonly known as:  ADVIL,MOTRIN      TAKE these medications       acetaminophen 500 MG tablet  Commonly known  as:  TYLENOL  Take 500 mg by mouth every 6 (six) hours as needed for pain.     promethazine 12.5 MG tablet  Commonly known as:  PHENERGAN  Take 2 tablets (25 mg total) by mouth every 6 (six) hours as needed for nausea.           Follow-up Information   Follow up with MAGICK-Daliah Chaudoin, MD. (As needed if symptoms worsen)    Contact information:   [address] [place] [address] [phone] cell [phone]      Follow up with Staci Righter, MD In 1 week.   Contact information:   [address] [place] [address] [phone]        The results of significant diagnostics from this hospitalization (including imaging, microbiology, ancillary and laboratory) are listed below for reference.     Microbiology: Recent Results (from the past 240 hour(s))  CULTURE, BLOOD (ROUTINE X 2)     Status: None   Collection Time    05/11/13  8:35 AM      Result Value Range Status   Specimen Description BLOOD RIGHT HAND   Final   Special Requests BOTTLES DRAWN AEROBIC AND ANAEROBIC 5CC EACH   Final   Culture  Setup Time 05/11/2013 15:54   Final   Culture     Final   Value:        BLOOD CULTURE RECEIVED NO GROWTH TO DATE CULTURE WILL BE HELD FOR 5 DAYS BEFORE ISSUING A FINAL NEGATIVE REPORT   Report Status PENDING   Incomplete  CULTURE, BLOOD (ROUTINE X 2)     Status: None   Collection Time    05/11/13  8:48 AM      Result Value Range Status   Specimen Description BLOOD LEFT ARM   Final   Special Requests BOTTLES DRAWN AEROBIC AND ANAEROBIC 5CC EACH   Final   Culture  Setup Time 05/11/2013 15:54   Final   Culture     Final   Value:        BLOOD CULTURE RECEIVED NO GROWTH TO DATE CULTURE WILL BE HELD FOR 5 DAYS BEFORE ISSUING A FINAL NEGATIVE REPORT   Report Status PENDING   Incomplete  URINE CULTURE     Status: None   Collection Time    05/11/13 10:07 AM      Result Value Range Status   Specimen Description URINE, RANDOM   Final   Special Requests  NONE    Final   Culture  Setup Time     Final   Value: 05/11/2013 17:09     Performed at [facility] Count     Final   Value: NO GROWTH     Performed at [facility]   Culture     Final   Value: NO GROWTH     Performed at [facility]   Report Status 05/12/2013 FINAL   Final  BODY FLUID CULTURE     Status: None   Collection Time    05/15/13 12:44 PM      Result Value Range Status   Specimen Description BONE MARROW CORE   Final   Special Requests NONE   Final   Gram Stain     Final   Value: NO WBC SEEN     NO ORGANISMS SEEN     Performed at [facility]   Culture PENDING   Incomplete   Report Status PENDING   Incomplete  FUNGUS CULTURE W SMEAR     Status: None   Collection Time    05/15/13 12:44 PM      Result Value Range Status   Specimen Description BONE MARROW CORE   Final   Special Requests NONE   Final   Fungal Smear     Final   Value: NO YEAST OR FUNGAL ELEMENTS SEEN     Performed at [facility]   Culture     Final   Value: CULTURE IN PROGRESS FOR FOUR WEEKS     Performed at [facility]   Report Status PENDING   Incomplete  CLOSTRIDIUM DIFFICILE BY PCR     Status: None   Collection Time    05/15/13  1:00 PM      Result Value Range Status   C difficile by pcr NEGATIVE  NEGATIVE Final   Comment: Performed at [facility]     Labs: Basic Metabolic Panel:  Recent Labs Lab 05/10/13 0540 05/12/13 0505 05/15/13 0450  NA 135 132* 132*  K 3.6 3.4* 3.7  CL 100 97 100  CO2 26 24 24   GLUCOSE 109* 106* 96  BUN 6 7 9   CREATININE 0.69 0.67 0.76  CALCIUM 8.2* 8.3* 8.3*   Liver Function Tests:  Recent Labs Lab 05/12/13 0505  AST 38*  ALT 28  ALKPHOS 55  BILITOT 0.3  PROT 6.3  ALBUMIN 2.3*   CBC:  Recent Labs Lab 05/10/13 0540 05/11/13 0543 05/12/13 0505 05/13/13 0435 05/15/13 0450  WBC 11.6* 13.5* 13.0* 12.5* 12.6*  HGB 12.6* 13.2 12.2* 11.8* 12.0*  HCT 35.9* 37.9* 35.2* 34.1* 34.6*   MCV 83.5 84.2 83.8 83.8 84.4  PLT 220 290 326 342 328    SIGNED: Time coordinating discharge: Over 30 minutes  Debbora Presto, MD  [facility] 05/16/2013, 10:59 AM Pager [phone]  If 7PM-7AM, please contact night-coverage [email or web address] Password [email or web address]

## 2013-05-16 NOTE — Progress Notes (Signed)
[facility]  Date of Admission:  05/04/2013  Antibiotics:   Subjective: No new complaints   Objective: Temp:  [97.6 F (36.4 C)-101.3 F (38.5 C)] 97.6 F (36.4 C) (08/08 0937) Pulse Rate:  [87-105] 87 (08/08 0600) Resp:  [20-34] 20 (08/08 0600) BP: (97-124)/(56-86) 108/65 mmHg (08/08 0600) SpO2:  [94 %-99 %] 94 % (08/08 0600)  General: Awake, alert, nad CV: RRR Lungs: CTA Abd: soft, nt, nd  Lab Results Lab Results  Component Value Date   WBC 12.6* 05/15/2013   HGB 12.0* 05/15/2013   HCT 34.6* 05/15/2013   MCV 84.4 05/15/2013   PLT 328 05/15/2013    Lab Results  Component Value Date   CREATININE 0.76 05/15/2013   BUN 9 05/15/2013   NA 132* 05/15/2013   K 3.7 05/15/2013   CL 100 05/15/2013   CO2 24 05/15/2013    Lab Results  Component Value Date   ALT 28 05/12/2013   AST 38* 05/12/2013   ALKPHOS 55 05/12/2013   BILITOT 0.3 05/12/2013      Microbiology: Recent Results (from the past 240 hour(s))  CULTURE, BLOOD (ROUTINE X 2)     Status: None   Collection Time    05/11/13  8:35 AM      Result Value Range Status   Specimen Description BLOOD RIGHT HAND   Final   Special Requests BOTTLES DRAWN AEROBIC AND ANAEROBIC 5CC EACH   Final   Culture  Setup Time 05/11/2013 15:54   Final   Culture     Final   Value:        BLOOD CULTURE RECEIVED NO GROWTH TO DATE CULTURE WILL BE HELD FOR 5 DAYS BEFORE ISSUING A FINAL NEGATIVE REPORT   Report Status PENDING   Incomplete  CULTURE, BLOOD (ROUTINE X 2)     Status: None   Collection Time    05/11/13  8:48 AM      Result Value Range Status   Specimen Description BLOOD LEFT ARM   Final   Special Requests BOTTLES DRAWN AEROBIC AND ANAEROBIC 5CC EACH   Final   Culture  Setup Time 05/11/2013 15:54   Final   Culture     Final   Value:        BLOOD CULTURE RECEIVED NO GROWTH TO DATE CULTURE WILL BE HELD FOR 5 DAYS BEFORE ISSUING A FINAL NEGATIVE REPORT   Report Status PENDING   Incomplete  URINE CULTURE     Status: None     Collection Time    05/11/13 10:07 AM      Result Value Range Status   Specimen Description URINE, RANDOM   Final   Special Requests NONE   Final   Culture  Setup Time     Final   Value: 05/11/2013 17:09     Performed at [facility] Count     Final   Value: NO GROWTH     Performed at [facility]   Culture     Final   Value: NO GROWTH     Performed at [facility]   Report Status 05/12/2013 FINAL   Final  BODY FLUID CULTURE     Status: None   Collection Time    05/15/13 12:44 PM      Result Value Range Status   Specimen Description BONE MARROW CORE   Final   Special Requests NONE   Final   Gram Stain  Final   Value: NO WBC SEEN     NO ORGANISMS SEEN     Performed at [facility]   Culture PENDING   Incomplete   Report Status PENDING   Incomplete  CLOSTRIDIUM DIFFICILE BY PCR     Status: None   Collection Time    05/15/13  1:00 PM      Result Value Range Status   C difficile by pcr NEGATIVE  NEGATIVE Final   Comment: Performed at [facility]    Studies/Results: Ct Biopsy  05/15/2013   *RADIOLOGY REPORT*  Indication: Fever of unknown origin, evaluate for leukemia; please obtain an additional sample for infectious panel  CT GUIDED LEFT ILIAC BONE MARROW ASPIRATION AND BONE MARROW CORE BIOPSIES  Intravenous medications: Fentanyl 200 mcg IV  Sedation time: 10 minutes  Contrast volume: None  Complications: None immediate  PROCEDURE/FINDINGS:  Informed consent was obtained from the patient following an explanation of the procedure, risks, benefits and alternatives. The patient understands, agrees and consents for the procedure. All questions were addressed.  A time out was performed prior to the initiation of the procedure.  The patient was positioned prone and noncontrast localization CT was performed of the pelvis to demonstrate the iliac marrow spaces.  The operative site was prepped and draped in the usual sterile fashion.  Under  sterile conditions and local anesthesia, an 11 gauge coaxial bone biopsy needle was advanced into the left iliac marrow space. Needle position was confirmed with CT imaging.  Initially, bone marrow aspiration was performed. Next, a bone marrow biopsy was obtained with the 11 gauge outer bone marrow device. The 11 gauge coaxial bone biopsy needle was readvanced into a slightly different location within the left iliac marrow space, positioning was confirmed and an additional bone marrow biopsy was obtained. Samples were prepared with the cytotechnologist and deemed adequate. The second required sample was set aside for infection panel.  The needle was removed intact.  Hemostasis was obtained with compression and a dressing was placed. The patient tolerated the procedure well without immediate post procedural complication.  IMPRESSION:  1.  Successful CT guided left iliac bone marrow aspiration and core biopsies.  2.  An additional bone marrow biopsy was set obtained for infectious panel as was ordered by the primary team.   Original Report Authenticated By: Tacey Ruiz, MD    Assessment/Plan: 1) Fever without a source - no etiology identified.  Negative echo.  Neck CT unremarkable   -CMV, EBV negative for acute infection.  -bone marrow results pending -seen by pulmonary and biopsy would require CT guided or thoracic approach, not ameneable to bronchoscopy.  -I have discussed the findings with the patient, so far, all is negative.  His lad in his chest is not very impressive at this point.  Bone marrow is negative.  I discussed options of invasive procedures such as mediastinal biopsy and watchful waiting.  My concern of invasive procedures is low yield and difficulty in obtaining specimen, small size of lad.  At this point, we will do watchful waiting, follow cultures of bone marrow and he will follow up with me on Tuesday next week in the office.  Tylenol/ibuprofen for fever.  Patient agreeable to plan.      Staci Righter, MD [facility] [email or web address] [phone] pager   [phone] cell 05/16/2013, 9:49 AM

## 2013-05-17 LAB — CULTURE, BLOOD (ROUTINE X 2): Culture: NO GROWTH

## 2013-05-18 LAB — BODY FLUID CULTURE: Gram Stain: NONE SEEN

## 2013-05-19 ENCOUNTER — Telehealth: Payer: Self-pay | Admitting: *Deleted

## 2013-05-19 LAB — CMV (CYTOMEGALOVIRUS) DNA ULTRAQUANT, PCR: CMV DNA Quant: <200 copies/mL (ref <200)

## 2013-05-19 NOTE — Telephone Encounter (Signed)
Called patient to remind him of his hospital follow up scheduled for 05/20/13 with Dr. Luciana Axe. Per pt, he is currently hospitalized in [facility] (unsure of which hospital). He thinks he is being discharged today, will call if he is not. Andree Coss, RN

## 2013-05-20 ENCOUNTER — Inpatient Hospital Stay: Payer: Self-pay | Admitting: Internal Medicine

## 2013-05-26 LAB — CHROMOSOME ANALYSIS, BONE MARROW

## 2013-06-12 LAB — FUNGUS CULTURE W SMEAR: Fungal Smear: NONE SEEN

## 2013-06-27 LAB — AFB CULTURE WITH SMEAR (NOT AT ARMC): Acid Fast Smear: NONE SEEN

## 2013-07-08 ENCOUNTER — Encounter: Payer: Self-pay | Admitting: Internal Medicine

## 2013-09-02 ENCOUNTER — Encounter (HOSPITAL_COMMUNITY): Payer: Self-pay

## 2014-03-27 IMAGING — CT CT BIOPSY
1 series · 1 of 16 positions shown · non-contrast
Comparison: none

INDICATION: Fever of unknown origin, evaluate for leukemia; please
obtain an additional sample for infectious panel

[Series 2: localizer · axial · 5.0mm · 0.54mm/px · 1 of 16 slices shown]
[im 9/16]
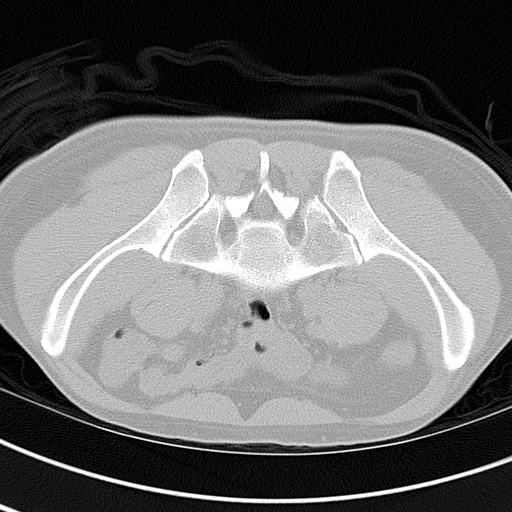

[1 of 16 positions shown; findings below may reference images not displayed]

CT GUIDED LEFT ILIAC BONE MARROW ASPIRATION AND BONE MARROW CORE
BIOPSIES

Intravenous medications: Fentanyl 200 mcg IV

Sedation time: 10 minutes

Contrast volume: None

Complications: None immediate

PROCEDURE/FINDINGS:

Informed consent was obtained from the patient following an
explanation of the procedure, risks, benefits and alternatives.
The patient understands, agrees and consents for the procedure.
All questions were addressed.  A time out was performed prior to
the initiation of the procedure.  The patient was positioned prone
and noncontrast localization CT was performed of the pelvis to
demonstrate the iliac marrow spaces.  The operative site was
prepped and draped in the usual sterile fashion.

Under sterile conditions and local anesthesia, an 11 gauge coaxial
bone biopsy needle was advanced into the left iliac marrow space.
Needle position was confirmed with CT imaging.  Initially, bone
marrow aspiration was performed. Next, a bone marrow biopsy was
obtained with the 11 gauge outer bone marrow device. The 11 gauge
coaxial bone biopsy needle was readvanced into a slightly different
location within the left iliac marrow space, positioning was
confirmed and an additional bone marrow biopsy was obtained.
Samples were prepared with the cytotechnologist and deemed
adequate. The second required sample was set aside for infection
panel.  The needle was removed intact.  Hemostasis was obtained
with compression and a dressing was placed. The patient tolerated
the procedure well without immediate post procedural complication.
IMPRESSION: 1.  Successful CT guided left iliac bone marrow aspiration and core
biopsies.

2.  An additional bone marrow biopsy was set obtained for
infectious panel as was ordered by the primary team.

## 2016-11-10 ENCOUNTER — Ambulatory Visit (HOSPITAL_COMMUNITY)
Admission: EM | Admit: 2016-11-10 | Discharge: 2016-11-10 | Disposition: A | Discharge status: Home / Self Care | Payer: Self-pay | Attending: Family Medicine | Admitting: Family Medicine

## 2016-11-10 ENCOUNTER — Encounter (HOSPITAL_COMMUNITY): Payer: Self-pay | Admitting: Emergency Medicine

## 2016-11-10 DIAGNOSIS — S39011A Strain of muscle, fascia and tendon of abdomen, initial encounter: Secondary | ICD-10-CM

## 2016-11-10 NOTE — ED Triage Notes (Signed)
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 NAD

## 2016-11-10 NOTE — ED Provider Notes (Signed)
CSN: [id number]     Arrival date & time 11/10/16  1442 History   First MD Initiated Contact with Patient 11/10/16 1523     Chief Complaint  Patient presents with  . Hernia   (Consider location/radiation/quality/duration/timing/severity/associated sxs/prior Treatment) Patient c/o left lateral abdominal tenderness after lifting at work and wants to get checked for hernia.  He denies any inguinal or scrotal pain   The history is provided by the patient.  Abdominal Pain  Pain location:  LUQ Pain quality: aching   Pain radiates to:  Does not radiate Pain severity:  Mild Onset quality:  Sudden Duration:  2 days Timing:  Unable to specify Chronicity:  New   History reviewed. No pertinent past medical history. History reviewed. No pertinent surgical history. History reviewed. No pertinent family history. Social History  Substance Use Topics  . Smoking status: Former Games developermoker  . Smokeless tobacco: Never Used  . Alcohol use Yes    Review of Systems  Constitutional: Negative.   HENT: Negative.   Eyes: Negative.   Respiratory: Negative.   Cardiovascular: Negative.   Gastrointestinal: Positive for abdominal pain.  Endocrine: Negative.   Genitourinary: Negative.   Musculoskeletal: Negative.   Allergic/Immunologic: Negative.   Neurological: Negative.   Hematological: Negative.   Psychiatric/Behavioral: Negative.     Allergies  Patient has no known allergies.  Home Medications   Prior to Admission medications   Medication Sig Start Date End Date Taking? Authorizing Provider  acetaminophen (TYLENOL) 500 MG tablet Take 500 mg by mouth every 6 (six) hours as needed for pain.    Historical Provider, MD  promethazine (PHENERGAN) 12.5 MG tablet Take 2 tablets (25 mg total) by mouth every 6 (six) hours as needed for nausea. 05/16/13   Dorothea OgleIskra M Myers, MD   Meds Ordered and Administered this Visit  Medications - No data to display  BP 141/62 (BP Location: Left Arm)   Pulse 92   Temp  98 F (36.7 C) (Oral)   Resp 18   SpO2 100%  No data found.   Physical Exam  Constitutional: He appears well-developed and well-nourished.  HENT:  Head: Normocephalic and atraumatic.  Eyes: Conjunctivae and EOM are normal. Pupils are equal, round, and reactive to light.  Neck: Normal range of motion. Neck supple.  Cardiovascular: Normal rate, regular rhythm and normal heart sounds.   Pulmonary/Chest: Effort normal and breath sounds normal.  Musculoskeletal: He exhibits tenderness.  No hernia seen on abdomen.  TTP left upper lateral abdomen.  Nursing note and vitals reviewed.   Urgent Care Course     Procedures (including critical care time)  Labs Review Labs Reviewed - No data to display  Imaging Review No results found.   Visual Acuity Review  Right Eye Distance:   Left Eye Distance:   Bilateral Distance:    Right Eye Near:   Left Eye Near:    Bilateral Near:         MDM   1. Strain of abdominal muscle, initial encounter    Reassurance given Take tylenol and motrin otc as directed.    Deatra CanterWilliam J Karelly Dewalt, FNP 11/10/16 1537

## 2024-06-09 ENCOUNTER — Encounter (HOSPITAL_COMMUNITY): Payer: Self-pay | Admitting: Emergency Medicine

## 2024-06-09 ENCOUNTER — Ambulatory Visit (HOSPITAL_COMMUNITY)
Admission: EM | Admit: 2024-06-09 | Discharge: 2024-06-09 | Disposition: A | Discharge status: Home / Self Care | Payer: Self-pay | Attending: Internal Medicine | Admitting: Internal Medicine

## 2024-06-09 ENCOUNTER — Other Ambulatory Visit: Payer: Self-pay

## 2024-06-09 DIAGNOSIS — R0789 Other chest pain: Secondary | ICD-10-CM | POA: Insufficient documentation

## 2024-06-09 DIAGNOSIS — F439 Reaction to severe stress, unspecified: Secondary | ICD-10-CM | POA: Insufficient documentation

## 2024-06-09 DIAGNOSIS — R202 Paresthesia of skin: Secondary | ICD-10-CM | POA: Insufficient documentation

## 2024-06-09 DIAGNOSIS — G4709 Other insomnia: Secondary | ICD-10-CM | POA: Insufficient documentation

## 2024-06-09 DIAGNOSIS — R030 Elevated blood-pressure reading, without diagnosis of hypertension: Secondary | ICD-10-CM | POA: Insufficient documentation

## 2024-06-09 LAB — COMPREHENSIVE METABOLIC PANEL WITH GFR
ALT: 40 U/L (ref 0–44)
AST: 30 U/L (ref 15–41)
Albumin: 4.6 g/dL (ref 3.5–5.0)
Alkaline Phosphatase: 69 U/L (ref 38–126)
Anion gap: 14 (ref 5–15)
BUN: 10 mg/dL (ref 6–20)
CO2: 22 mmol/L (ref 22–32)
Calcium: 9.5 mg/dL (ref 8.9–10.3)
Chloride: 100 mmol/L (ref 98–111)
Creatinine, Ser: 0.82 mg/dL (ref 0.61–1.24)
GFR, Estimated: >60 mL/min (ref >60)
Glucose, Bld: 96 mg/dL (ref 70–99)
Potassium: 4.2 mmol/L (ref 3.5–5.1)
Sodium: 136 mmol/L (ref 135–145)
Total Bilirubin: 1 mg/dL (ref 0.0–1.2)
Total Protein: 7.8 g/dL (ref 6.5–8.1)

## 2024-06-09 LAB — POCT URINALYSIS DIP (MANUAL ENTRY)
Bilirubin, UA: NEGATIVE
Blood, UA: NEGATIVE
Glucose, UA: NEGATIVE mg/dL
Ketones, POC UA: NEGATIVE mg/dL
Leukocytes, UA: NEGATIVE
Nitrite, UA: NEGATIVE
Protein Ur, POC: NEGATIVE mg/dL
Spec Grav, UA: 1.01 (ref 1.010–1.025)
Urobilinogen, UA: 0.2 U/dL
pH, UA: 6.5 (ref 5.0–8.0)

## 2024-06-09 LAB — CBC
HCT: 48.9 % (ref 39.0–52.0)
Hemoglobin: 17.5 g/dL — ABNORMAL HIGH (ref 13.0–17.0)
MCH: 30.8 pg (ref 26.0–34.0)
MCHC: 35.8 g/dL (ref 30.0–36.0)
MCV: 85.9 fL (ref 80.0–100.0)
Platelets: 234 K/uL (ref 150–400)
RBC: 5.69 MIL/uL (ref 4.22–5.81)
RDW: 12.1 % (ref 11.5–15.5)
WBC: 7.6 K/uL (ref 4.0–10.5)
nRBC: 0 % (ref 0.0–0.2)

## 2024-06-09 LAB — T4, FREE: Free T4: 0.82 ng/dL (ref 0.61–1.12)

## 2024-06-09 LAB — TSH: TSH: 3.812 u[IU]/mL (ref 0.350–4.500)

## 2024-06-09 NOTE — ED Provider Notes (Addendum)
 [facility]    CSN: [id number] Arrival date & time: 06/09/24  1209      History   Chief Complaint Chief Complaint  Patient presents with   Hypertension    HPI Faris Coolman is a 38 y.o. male who presents with having HA's on both temples x 2 weeks, and yesterday developed tingling and numbness of both hands and wrists. He works in Systems analyst use of hands.  Has been checking his BP and has been elevated. He admits he has been stressed and having insomnia. He only slept 2 hours last night. Has had some chests pains on occasion described as pressure of short duration, few minutes which occurs any time.   History reviewed. No pertinent past medical history.  Patient Active Problem List   Diagnosis Date Noted   Mediastinal lymphadenopathy 05/15/2013   Systolic murmur 05/12/2013   Hypokalemia 05/09/2013   Fever 05/05/2013   Hyponatremia 05/05/2013   Tachycardia 05/05/2013   Rash 05/05/2013    History reviewed. No pertinent surgical history.     Home Medications    Prior to Admission medications   Not on File    Family History History reviewed. No pertinent family history.  Social History Social History   Tobacco Use   Smoking status: Former   Smokeless tobacco: Never  Advertising account planner   Vaping status: Never Used  Substance Use Topics   Alcohol use: Yes   Drug use: No     Allergies   Patient has no known allergies.   Review of Systems Review of Systems As noted in HPI  Physical Exam Triage Vital Signs ED Triage Vitals  Encounter Vitals Group     BP 06/09/24 1338 (!) 175/107     Girls Systolic BP Percentile --      Girls Diastolic BP Percentile --      Boys Systolic BP Percentile --      Boys Diastolic BP Percentile --      Pulse Rate 06/09/24 1338 84     Resp 06/09/24 1338 18     Temp 06/09/24 1338 98.9 F (37.2 C)     Temp Source 06/09/24 1338 Oral     SpO2 06/09/24 1338 98 %     Weight --      Height --      Head  Circumference --      Peak Flow --      Pain Score 06/09/24 1334 4     Pain Loc --      Pain Education --      Exclude from Growth Chart --    No data found.  Updated Vital Signs BP (!) 164/98 (BP Location: Right Arm)   Pulse 84   Temp 98.9 F (37.2 C) (Oral)   Resp 18   SpO2 98%  I repeated his BP 157/96 Visual Acuity Right Eye Distance:   Left Eye Distance:   Bilateral Distance:    Right Eye Near:   Left Eye Near:    Bilateral Near:     Physical Exam Physical Exam Vitals signs and nursing note reviewed.  Constitutional:      General: he is not in acute distress.    Appearance: Normal appearance. He is not ill-appearing, toxic-appearing or diaphoretic.  HENT:     Head: Normocephalic.     Right Ear: external ear normal.     Left Ear: external ear normal.     Nose: Nose normal.    Eyes:  General: No scleral icterus.       Right eye: No discharge.        Left eye: No discharge.     Conjunctiva/sclera: Conjunctivae normal.  Neck:     Musculoskeletal: Neck supple. No neck rigidity.  Cardiovascular:     Rate and Rhythm: Normal rate and regular rhythm.     Heart sounds: Faint murmur heard.  Pulmonary:     Effort: Pulmonary effort is normal.     Breath sounds: Normal breath sounds.  Abdominal:     General: Bowel sounds are normal. There is no distension.     Palpations: Abdomen is soft. There is no mass.     Tenderness: There is no abdominal tenderness. There is no guarding or rebound.     Hernia: No hernia is present.  Musculoskeletal: Normal range of motion.  Lymphadenopathy:     Cervical: No cervical adenopathy.  Skin:    General: Skin is warm and dry.     Coloration: Skin is not jaundiced.     Findings: No rash.  Neurological:     Mental Status: he is alert and oriented to person, place, and time. Has normal grip bilaterally. Has negative Phalen's  and Tinel's test.     Gait: Gait normal.  Psychiatric:        Mood and Affect: Mood normal.         Behavior: Behavior normal.        Thought Content: Thought content normal.        Judgment: Judgment normal.    UC Treatments / Results  Labs (all labs ordered are listed, but only abnormal results are displayed) Labs Reviewed  CBC  COMPREHENSIVE METABOLIC PANEL WITH GFR  TSH  T4, FREE  T3, FREE  POCT URINALYSIS DIP (MANUAL ENTRY)   UA is normal.   EKG normal sinus rhythm with possible L atrial enlargement Borderline EKG.   Radiology No results found.  Procedures Procedures (including critical care time)  Medications Ordered in UC Medications - No data to display  Initial Impression / Assessment and Plan / UC Course  I have reviewed the triage vital signs and the nursing notes.  Pertinent labs & imaging results that were available during my care of the patient were reviewed by me and considered in my medical decision making (see chart for details).  Elevated blood pressure which may be from poor sleep and stress. I ordered CBC and CMP I advised him to do BP diaries when he gets good sleep and takes it to PCP  Insomnia Advised to take Melatonin and Magnesium to help him sleep and help with stress. See instructions. I ordered thyroid studies  Paresthesia or hands and wrist, with normal neuro exam.  Can FU with PCP for further work up.   We will inform him of his results when they come back.     Final Clinical Impressions(s) / UC Diagnoses   Final diagnoses:  Elevated blood pressure reading  Other chest pain  Other insomnia  Feeling stressed out     Discharge Instructions      Tu electrocardiograma no demuestra que tuvistes un ataque al corazon, pero la parde de arriva del corazon puede ser un poco grande. La mejor manera de ver eso, es por medio de una sonografia del Counsellor, y tu doctora de cabezera tiene que ordenar eso.  Mientras tando tome su precion cada dia y escribalo, para que le lleves a tu doctor.   Pare que puedas dormir  compra Melatonin 3  mg y tome 1-3 por noche. Tambien puedes tomar Magnesio L-threonate y glycinate por noche de acuerdo lo que dice la botella.       ED Prescriptions   None    PDMP not reviewed this encounter.   Lindi Carter, PA-C 06/09/24 1517    Rodriguez-Southworth, [place], PA-C 06/09/24 1528

## 2024-06-09 NOTE — ED Notes (Signed)
 Patient is concerned for a bump he feels on left side of torso for 8 years.  Patient is also concerned for bump to center of left upper eye lid -present for 6 months and seen at another clinic for this and told he would need a specialist to surgically removed.  Asking for guidance

## 2024-06-09 NOTE — ED Triage Notes (Signed)
 Reports blood pressure is high, tingling in arm and headache.     Complains of headache (2 weeks) to both sides of head and tingling/numbness ( yesterday) in both wrists and hands.    Has not taken any medications

## 2024-06-09 NOTE — Discharge Instructions (Signed)
 Tu electrocardiograma no demuestra que tuvistes un ataque al corazon, pero la parde de arriva del corazon puede ser un poco grande. La mejor manera de ver eso, es por medio de una sonografia del Counsellor, y tu doctora de cabezera tiene que ordenar eso.  Mientras tando tome su precion cada dia y escribalo, para que le lleves a tu doctor.   Pare que puedas dormir compra Melatonin 3 mg y tome 1-3 por noche. Tambien puedes tomar Magnesio L-threonate y glycinate por noche de acuerdo lo que dice la botella.

## 2024-06-10 ENCOUNTER — Ambulatory Visit (HOSPITAL_COMMUNITY): Payer: Self-pay

## 2024-06-11 LAB — T3, FREE: T3, Free: 3 pg/mL (ref 2.0–4.4)

## 2024-06-16 ENCOUNTER — Ambulatory Visit: Payer: Self-pay

## 2024-06-16 NOTE — Telephone Encounter (Signed)
 Pt requesting new pt appt, scheduled.   Copied from CRM [id number]. Topic: Clinical - Red Word Triage >> Jun 16, 2024 11:33 AM Antwanette L wrote: Red Word that prompted transfer to Nurse Triage: Pt friend (laua botellos) is calling in on behalf of the pt. Pt is experiencing numbness(both hands), fatigue, and loss of appetite. >> Jun 16, 2024 11:35 AM Antwanette L wrote:  fernando  [phone] [place] interpreter service

## 2024-08-05 ENCOUNTER — Ambulatory Visit: Payer: Self-pay | Admitting: Family Medicine

## 2024-08-05 ENCOUNTER — Telehealth: Payer: Self-pay | Admitting: General Practice

## 2024-08-05 NOTE — Telephone Encounter (Signed)
 Called pt to reschedule missed appt; could not reach or leave vm
